# Patient Record
Sex: Male | Born: 1985 | Race: Asian | Hispanic: No | Marital: Married | Smoking: Never smoker
Health system: Southern US, Community
[De-identification: ages and names within clinical notes are randomized; demographics above are authoritative.]

## PROBLEM LIST (undated history)

## (undated) DIAGNOSIS — M109 Gout, unspecified: Secondary | ICD-10-CM

## (undated) HISTORY — DX: Gout, unspecified: M10.9

---

## 2013-03-12 DIAGNOSIS — E66812 Obesity, class 2: Secondary | ICD-10-CM | POA: Insufficient documentation

## 2013-03-12 DIAGNOSIS — E669 Obesity, unspecified: Secondary | ICD-10-CM | POA: Insufficient documentation

## 2013-03-15 DIAGNOSIS — E785 Hyperlipidemia, unspecified: Secondary | ICD-10-CM | POA: Insufficient documentation

## 2013-03-15 DIAGNOSIS — E559 Vitamin D deficiency, unspecified: Secondary | ICD-10-CM | POA: Insufficient documentation

## 2015-03-02 ENCOUNTER — Ambulatory Visit (INDEPENDENT_AMBULATORY_CARE_PROVIDER_SITE_OTHER): Payer: Worker's Compensation | Admitting: Family Medicine

## 2015-03-02 VITALS — BP 124/80 | HR 95 | Temp 98.7°F | Resp 17 | Ht 68.0 in | Wt 275.0 lb

## 2015-03-02 DIAGNOSIS — T1591XA Foreign body on external eye, part unspecified, right eye, initial encounter: Secondary | ICD-10-CM

## 2015-03-02 MED ORDER — ERYTHROMYCIN 5 MG/GM OP OINT: 1.0000 "application " | TOPICAL_OINTMENT | Freq: Four times a day (QID) | OPHTHALMIC | Status: DC

## 2015-03-02 NOTE — Progress Notes (Signed)
Linnell Fullinged Illingworth 1985/10/06 30 y.o.   Chief Complaint  Patient presents with  . dirt in eyes     Date of Injury: 03/02/15 approximately 9:30 am  History of Present Illness:  Presents for evaluation of work-related complaint. 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 He thinks he had some dirt on his head. He used the eye wash machine at work, but feels like there remains a foreign body. No tearing or discharge. Has foreign body sensation at outer, upper edge. No blurred vision, no orbital pain.   No past medical history on file. No past surgical history on file. No family history on file. Social History  Substance Use Topics  . Smoking status: Never Smoker   . Smokeless tobacco: None  . Alcohol Use: None  Medications, allergies, past medical history, surgical history, family history, social history and problem list reviewed and updated.    Review of Systems  Eyes: Positive for pain and redness. Negative for blurred vision, double vision, photophobia and discharge.    No Known Allergies   Current medications reviewed and updated.   Physical Exam  Constitutional: He is oriented to person, place, and time and well-developed, well-nourished, and in no distress.  HENT:  Head: Normocephalic and atraumatic.  Eyes: EOM are normal. Pupils are equal, round, and reactive to light. Right eye exhibits no discharge. Left eye exhibits no discharge. Right conjunctiva is injected. Right conjunctiva has no hemorrhage. Left conjunctiva is not injected. Left conjunctiva has no hemorrhage.  Right eye irrigated with copious amounts of normal saline then eye numbed with proparacaine and fluorescein applied. There were no areas of uptake under black light.   Pulmonary/Chest: Effort normal.  Musculoskeletal: Normal range of motion.  Neurological: He is alert and oriented to  person, place, and time.  Skin: Skin is warm and dry.  Psychiatric: Mood, memory, affect and judgment normal.  Vitals reviewed.   BP 124/80 mmHg  Pulse 95  Temp(Src) 98.7 F (37.1 C) (Oral)  Resp 17  Ht 5\' 8"  (1.727 m)  Wt 275 lb (124.739 kg)  BMI 41.82 kg/m2  SpO2 97%  vision- right- 20/20, left 20/20, both 20/20 Assessment and Plan:  1. Foreign body, eye, right, initial encounter - patient reports improvement in symptoms following irrigation - written and verbal instructions provided for OTC lubricating gel for comfort, avoid rubbing eyes.  - RTC if any worsening or if no improvement in 2-3 days and will refer to opthalmology.  - erythromycin ophthalmic ointment; Place 1 application into the right eye 4 (four) times daily. For 5 days  Dispense: 3.5 g; Refill: 0   Olean Reeeborah Itzamar Traynor, FNP-BC  [facility]  03/04/2015 8:07 AM

## 2015-03-02 NOTE — Patient Instructions (Signed)
Please use some over the counter lubricating eye gel  I have sent in an antibiotic to your pharmacy If you are not better in 2-3 days, come back in please. If your vision worsens or you get severe pain, come in immediately or go to the ER. Avoid rubbing your eye and wear sunglasses as needed for comfort.

## 2015-11-28 DIAGNOSIS — J3089 Other allergic rhinitis: Secondary | ICD-10-CM | POA: Insufficient documentation

## 2017-04-18 DIAGNOSIS — G8929 Other chronic pain: Secondary | ICD-10-CM | POA: Insufficient documentation

## 2017-05-09 DIAGNOSIS — G8929 Other chronic pain: Secondary | ICD-10-CM | POA: Insufficient documentation

## 2018-11-15 ENCOUNTER — Emergency Department (HOSPITAL_COMMUNITY)
Admission: EM | Admit: 2018-11-15 | Discharge: 2018-11-15 | Disposition: A | Discharge status: Home / Self Care | Payer: No Typology Code available for payment source | Attending: Emergency Medicine | Admitting: Emergency Medicine

## 2018-11-15 ENCOUNTER — Encounter (HOSPITAL_COMMUNITY): Payer: Self-pay | Admitting: Emergency Medicine

## 2018-11-15 ENCOUNTER — Emergency Department (HOSPITAL_COMMUNITY): Payer: Self-pay | Attending: Emergency Medicine

## 2018-11-15 ENCOUNTER — Other Ambulatory Visit: Payer: Self-pay

## 2018-11-15 DIAGNOSIS — Y990 Civilian activity done for income or pay: Secondary | ICD-10-CM | POA: Insufficient documentation

## 2018-11-15 DIAGNOSIS — M545 Low back pain, unspecified: Secondary | ICD-10-CM

## 2018-11-15 DIAGNOSIS — X500XXA Overexertion from strenuous movement or load, initial encounter: Secondary | ICD-10-CM | POA: Diagnosis not present

## 2018-11-15 DIAGNOSIS — Y9289 Other specified places as the place of occurrence of the external cause: Secondary | ICD-10-CM | POA: Diagnosis not present

## 2018-11-15 DIAGNOSIS — Y9389 Activity, other specified: Secondary | ICD-10-CM | POA: Insufficient documentation

## 2018-11-15 MED ORDER — LIDOCAINE 5 % EX PTCH
1.0000 | MEDICATED_PATCH | CUTANEOUS | Status: DC
  Administered 2018-11-15: 1 via TRANSDERMAL
  Filled 2018-11-15: qty 1

## 2018-11-15 MED ORDER — METHOCARBAMOL 500 MG PO TABS
500.0000 mg | ORAL_TABLET | Freq: Once | ORAL | Status: AC
  Administered 2018-11-15: 500 mg via ORAL
  Filled 2018-11-15: qty 1

## 2018-11-15 MED ORDER — NAPROXEN 250 MG PO TABS
500.0000 mg | ORAL_TABLET | Freq: Once | ORAL | Status: AC
  Administered 2018-11-15: 500 mg via ORAL
  Filled 2018-11-15: qty 2

## 2018-11-15 MED ORDER — NAPROXEN 500 MG PO TABS: 500.0000 mg | ORAL_TABLET | Freq: Two times a day (BID) | ORAL | 0 refills | Status: DC

## 2018-11-15 MED ORDER — METHOCARBAMOL 500 MG PO TABS: 500.0000 mg | ORAL_TABLET | Freq: Two times a day (BID) | ORAL | 0 refills | Status: DC

## 2018-11-15 MED ORDER — OXYCODONE-ACETAMINOPHEN 5-325 MG PO TABS
1.0000 | ORAL_TABLET | Freq: Once | ORAL | Status: AC
  Administered 2018-11-15: 1 via ORAL
  Filled 2018-11-15: qty 1

## 2018-11-15 NOTE — ED Triage Notes (Signed)
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

## 2018-11-15 NOTE — ED Notes (Signed)
Ambulated pt in room on pulse ox, pt SpO2 sats stayed at 98%

## 2018-11-15 NOTE — Discharge Instructions (Signed)
You were seen here today for Back Pain: Low back pain is discomfort in the lower back that may be due to injuries to muscles and ligaments around the spine. Occasionally, it may be caused by a problem to a part of the spine called a disc. Your back pain should be treated with medicines listed below as well as back exercises and this back pain should get better over the next 2 weeks. Most patients get completely well in 4 weeks. It is important to know however, if you develop severe or worsening pain, low back pain with fever, numbness, weakness or inability to walk or urinate, you should return to the ER immediately.  Please follow up with your doctor this week for a recheck if still having symptoms.  HOME INSTRUCTIONS Self - care:  The application of heat can help soothe the pain.  Maintaining your daily activities, including walking (this is encouraged), as it will help you get better faster than just staying in bed. Do not life, push, pull anything more than 10 pounds for the next week. I am attaching back exercises that you can do at home to help facilitate your recovery.   Back Exercises - I have attached a handout on back exercises that can be done at home to help facilitate your recovery.   Medications are also useful to help with pain control.   Acetaminophen.  This medication is generally safe, and found over the counter. Take as directed for your age. You should not take more than 8 of the extra strength (500mg ) pills a day (max dose is 4000mg  total OVER one day)  Lidocaine patches: You can purchase over-the-counter salon pas lidocaine patches (blue and silver box), these can be placed directly over the area of pain in last 12 hours.  They are safe and do not interact with other medications.  Non steroidal anti inflammatory: This includes medications including Ibuprofen, naproxen and Mobic; These medications help both pain and swelling and are very useful in treating back pain.  They should be  taken with food, as they can cause stomach upset, and more seriously, stomach bleeding. Do not combine the medications.   Muscle relaxants:  These medications can help with muscle tightness that is a cause of lower back pain.  Most of these medications can cause drowsiness, and it is not safe to drive or use dangerous machinery while taking them. They are primarily helpful when taken at night before sleep.   You will need to follow up with your primary healthcare provider or the Orthopedist in 1-2 weeks for reassessment and persistent symptoms.  Be aware that if you develop new symptoms, such as a fever, leg weakness, difficulty with or loss of control of your urine or bowels, abdominal pain, or more severe pain, you will need to seek medical attention and/or return to the Emergency department.  Additional Information:  Your vital signs today were: BP 131/89 (BP Location: Right Arm)    Pulse 65    Temp (!) 97.5 F (36.4 C) (Oral)    Resp 17    SpO2 99%  If your blood pressure (BP) was elevated above 135/85 this visit, please have this repeated by your doctor within one month. ---------------

## 2018-11-15 NOTE — ED Provider Notes (Signed)
[facility] Note   CSN: [id number] Arrival date & time: 11/15/18  0505     History   Chief Complaint Chief Complaint  Patient presents with  . Back Injury    HPI Marvin Hodge is a 33 y.o. male.     Marvin Hodge is a 33 y.o. male with a history of obesity and hyperlipidemia, who presents to the emergency department for evaluation of back injury.  Patient reports while at work this morning he was in a crouched position and tried to lift something and pull it up and felt pain in the middle of his low back. He denies any fall or trauma to the back.  Reports he had a back injury about a year ago, but has not had any issues with it since then.  He denies any pain radiating into the legs.  No numbness, tingling or weakness.  No loss of bowel or bladder control or saddle anesthesia.  No associated abdominal pain, urinary symptoms, fevers or vomiting.  He has not taken any medications prior to arrival, was given a Percocet on arrival and reports significant improvement in his pain.     History reviewed. No pertinent past medical history.  Patient Active Problem List   Diagnosis Date Noted  . HLD (hyperlipidemia) 03/15/2013  . Avitaminosis D 03/15/2013  . Class 2 obesity 03/12/2013    History reviewed. No pertinent surgical history.      Home Medications    Prior to Admission medications   Medication Sig Start Date End Date Taking? Authorizing Provider  erythromycin ophthalmic ointment Place 1 application into the right eye 4 (four) times daily. For 5 days 03/02/15   Emi BelfastGessner, Deborah B, FNP    Family History No family history on file.  Social History Social History   Tobacco Use  . Smoking status: Never Smoker  . Smokeless tobacco: Never Used  Substance Use Topics  . Alcohol use: Never    Alcohol/week: 0.0 standard drinks    Frequency: Never  . Drug use: Never     Allergies   Patient has no known allergies.   Review of  Systems Review of Systems  Constitutional: Negative for chills and fever.  HENT: Negative.   Respiratory: Negative for shortness of breath.   Cardiovascular: Negative for chest pain.  Gastrointestinal: Negative for abdominal pain, constipation, diarrhea, nausea and vomiting.  Genitourinary: Negative for dysuria, flank pain, frequency and hematuria.  Musculoskeletal: Positive for back pain. Negative for arthralgias, gait problem, joint swelling, myalgias and neck pain.  Skin: Negative for color change, rash and wound.  Neurological: Negative for weakness and numbness.     Physical Exam Updated Vital Signs BP 131/89 (BP Location: Right Arm)   Pulse 65   Temp (!) 97.5 F (36.4 C) (Oral)   Resp 17   SpO2 99%   Physical Exam Vitals signs and nursing note reviewed.  Constitutional:      General: He is not in acute distress.    Appearance: Normal appearance. He is well-developed and normal weight. He is not diaphoretic.     Comments: Well-appearing and in no acute distress.  HENT:     Head: Atraumatic.  Eyes:     General:        Right eye: No discharge.        Left eye: No discharge.  Neck:     Musculoskeletal: Neck supple.  Cardiovascular:     Rate and Rhythm: Normal rate and regular rhythm.  Pulses: Normal pulses.          Radial pulses are 2+ on the right side and 2+ on the left side.       Dorsalis pedis pulses are 2+ on the right side and 2+ on the left side.       Posterior tibial pulses are 2+ on the right side and 2+ on the left side.     Heart sounds: Normal heart sounds.  Pulmonary:     Effort: Pulmonary effort is normal. No respiratory distress.     Breath sounds: Normal breath sounds.     Comments: Respirations equal and unlabored, patient able to speak in full sentences, lungs clear to auscultation bilaterally Abdominal:     General: Bowel sounds are normal. There is no distension.     Palpations: Abdomen is soft. There is no mass.     Tenderness: There is  no abdominal tenderness. There is no guarding.     Comments: Abdomen soft, nondistended, nontender to palpation in all quadrants without guarding or peritoneal signs, no CVA tenderness bilaterally  Musculoskeletal:     Comments: Tenderness to palpation over midline lumbar spine just above the sacrum, no overlying skin changes or palpable deformity..  Pain made worse with range of motion of the lower extremities, negative straight leg raise bilaterally.  Skin:    General: Skin is warm and dry.     Capillary Refill: Capillary refill takes less than 2 seconds.  Neurological:     Mental Status: He is alert and oriented to person, place, and time.     Comments: Alert, clear speech, following commands. Moving all extremities without difficulty. Bilateral lower extremities with 5/5 strength in proximal and distal muscle groups and with dorsi and plantar flexion. Sensation intact in bilateral lower extremities. 2+ patellar DTRs bilaterally. Ambulatory with steady gait  Psychiatric:        Mood and Affect: Mood normal.        Behavior: Behavior normal.      ED Treatments / Results  Labs (all labs ordered are listed, but only abnormal results are displayed) Labs Reviewed - No data to display  EKG None  Radiology Dg Lumbar Spine Complete  Result Date: 11/15/2018 CLINICAL DATA:  Injury with low back pain EXAM: LUMBAR SPINE - COMPLETE 4+ VIEW COMPARISON:  None. FINDINGS: No evidence of acute fracture. 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 Mild L4-5 and L5-S1 disc narrowing which may be partially developmental. IMPRESSION: 1. No acute finding. 2. 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 Electronically Signed   By: Marnee Spring M.D.   On: 11/15/2018 05:57    Procedures Procedures (including critical care time)  Medications Ordered in ED Medications  methocarbamol (ROBAXIN) tablet 500 mg (has no administration in time range)  naproxen (NAPROSYN) tablet 500 mg (has no  administration in time range)  lidocaine (LIDODERM) 5 % 1 patch (has no administration in time range)  oxyCODONE-acetaminophen (PERCOCET/ROXICET) 5-325 MG per tablet 1 tablet (1 tablet Oral Given 11/15/18 0527)     Initial Impression / Assessment and Plan / ED Course  I have reviewed the triage vital signs and the nursing notes.  Pertinent labs & imaging results that were available during my care of the patient were reviewed by me and considered in my medical decision making (see chart for details).  Patient with back pain after an injury at work where he was crouched in a position and tried to lift something and felt like  he pulled a muscle in his back..  No neurological deficits and normal neuro exam.  Patient can walk but states is painful.  No loss of bowel or bladder control.  No concern for cauda equina.  No fever, night sweats, weight loss, h/o cancer, IVDU.  X-ray shows no acute findings, there is an L5 pars defect with mild lower lumbar disc narrowing but no acute abnormality.  Pain significantly improved with treatment here in the ED and patient is ambulatory.  Will write patient out of work for the next few days.  RICE protocol and pain medicine indicated and discussed with patient.    Final Clinical Impressions(s) / ED Diagnoses   Final diagnoses:  Acute midline low back pain without sciatica    ED Discharge Orders         Ordered    naproxen (NAPROSYN) 500 MG tablet  2 times daily     11/15/18 0859    methocarbamol (ROBAXIN) 500 MG tablet  2 times daily     11/15/18 0859           Jacqlyn Larsen, PA-C 11/15/18 9163    Davonna Belling, MD 11/15/18 1555

## 2020-02-07 ENCOUNTER — Encounter (HOSPITAL_COMMUNITY): Payer: Self-pay

## 2020-02-07 ENCOUNTER — Other Ambulatory Visit: Payer: Self-pay

## 2020-02-07 ENCOUNTER — Ambulatory Visit (HOSPITAL_COMMUNITY)
Admission: EM | Admit: 2020-02-07 | Discharge: 2020-02-07 | Disposition: A | Discharge status: Home / Self Care | Payer: Commercial Managed Care - PPO | Attending: Family Medicine | Admitting: Family Medicine

## 2020-02-07 ENCOUNTER — Ambulatory Visit (INDEPENDENT_AMBULATORY_CARE_PROVIDER_SITE_OTHER): Payer: Commercial Managed Care - PPO

## 2020-02-07 DIAGNOSIS — M25562 Pain in left knee: Secondary | ICD-10-CM

## 2020-02-07 DIAGNOSIS — M1712 Unilateral primary osteoarthritis, left knee: Secondary | ICD-10-CM | POA: Diagnosis not present

## 2020-02-07 MED ORDER — MELOXICAM 15 MG PO TABS: 15.0000 mg | ORAL_TABLET | Freq: Every day | ORAL | 0 refills | Status: DC | PRN

## 2020-02-07 MED ORDER — TIZANIDINE HCL 4 MG PO TABS: 4.0000 mg | ORAL_TABLET | Freq: Two times a day (BID) | ORAL | 0 refills | Status: DC | PRN

## 2020-02-07 MED ORDER — PREDNISONE 20 MG PO TABS: ORAL_TABLET | ORAL | 0 refills | Status: DC

## 2020-02-07 NOTE — Discharge Instructions (Signed)
Start and complete course of prednisone. Following completion of prednisone, start Meloxicam as needed for joint stiffness or joint aches. For acute pain, tizanidine 4 mg twice daily.  Avoid driving with medication as medication can cause significant drowsiness.  Recommend use of an Ace wrap for compression to reduce swelling and reduce pain. Follow-up with orthopedics if no improvement with prescribed treatment

## 2020-02-07 NOTE — ED Triage Notes (Signed)
Pt presents with left knee pain non injury related X 3 days.

## 2020-02-07 NOTE — ED Provider Notes (Signed)
[facility]    CSN: [id number] Arrival date & time: 02/07/20  1152      History   Chief Complaint Chief Complaint  Patient presents with  . Knee Pain    HPI Marvin Hodge is a 34 y.o. male.   HPI  Patient presents today with concerns of left knee pain and swelling x3 days.  Patient denies any injury.  He is taking ibuprofen and applying ice and reports that swelling somewhat resolved however he is having severe pain with flexing his left knee and with weightbearing.  Medical history significant for degenerative disc disease.  Patient is obese.  Denies any recent changes in activity that would have caused knee pain.  History reviewed. No pertinent past medical history.  Patient Active Problem List   Diagnosis Date Noted  . HLD (hyperlipidemia) 03/15/2013  . Avitaminosis D 03/15/2013  . Class 2 obesity 03/12/2013    History reviewed. No pertinent surgical history.     Home Medications    Prior to Admission medications   Medication Sig Start Date End Date Taking? Authorizing Provider  erythromycin ophthalmic ointment Place 1 application into the right eye 4 (four) times daily. For 5 days 03/02/15   Emi Belfast, FNP  methocarbamol (ROBAXIN) 500 MG tablet Take 1 tablet (500 mg total) by mouth 2 (two) times daily. 11/15/18   Dartha Lodge, PA-C  naproxen (NAPROSYN) 500 MG tablet Take 1 tablet (500 mg total) by mouth 2 (two) times daily. 11/15/18   Dartha Lodge, PA-C    Family History Family History  Family history unknown: Yes    Social History Social History   Tobacco Use  . Smoking status: Never Smoker  . Smokeless tobacco: Never Used  Substance Use Topics  . Alcohol use: Never    Alcohol/week: 0.0 standard drinks  . Drug use: Never     Allergies   Patient has no known allergies.   Review of Systems Review of Systems Pertinent negatives listed in HPI  Physical Exam Triage Vital Signs ED Triage Vitals  Enc Vitals Group     BP  02/07/20 1258 125/73     Pulse Rate 02/07/20 1258 76     Resp 02/07/20 1258 18     Temp 02/07/20 1258 98.1 F (36.7 C)     Temp Source 02/07/20 1258 Oral     SpO2 02/07/20 1258 99 %     Weight --      Height --      Head Circumference --      Peak Flow --      Pain Score 02/07/20 1257 7     Pain Loc --      Pain Edu? --      Excl. in GC? --    No data found.  Updated Vital Signs BP 125/73 (BP Location: Right Arm)   Pulse 76   Temp 98.1 F (36.7 C) (Oral)   Resp 18   SpO2 99%   Visual Acuity Right Eye Distance:   Left Eye Distance:   Bilateral Distance:    Right Eye Near:   Left Eye Near:    Bilateral Near:     Physical Exam Constitutional:      Appearance: He is obese.  Cardiovascular:     Rate and Rhythm: Normal rate and regular rhythm.  Pulmonary:     Effort: Pulmonary effort is normal.     Breath sounds: Normal breath sounds.  Musculoskeletal:     Left  knee: Bony tenderness present. Decreased range of motion. Tenderness present.       Legs:  Skin:    General: Skin is warm.     Capillary Refill: Capillary refill takes less than 2 seconds.  Neurological:     Mental Status: He is alert.  Psychiatric:        Attention and Perception: Attention normal.        Mood and Affect: Mood normal.        Thought Content: Thought content normal.        Cognition and Memory: Cognition and memory normal.      UC Treatments / Results  Labs (all labs ordered are listed, but only abnormal results are displayed) Labs Reviewed - No data to display  EKG   Radiology No results found.  Procedures Procedures (including critical care time)  Medications Ordered in UC Medications - No data to display  Initial Impression / Assessment and Plan / UC Course  I have reviewed the triage vital signs and the nursing notes.  Pertinent labs & imaging results that were available during my care of the patient were reviewed by me and considered in my medical decision making  (see chart for details).    Left knee pain, on x-ray, advanced arthritic changes noted on exam. Will trial a prednisone taper x 9 days. Tizanidine PRN for pain. Meloxicam PRN following prednisone taper. Follow-up with orthopedics if not improvement.  Final Clinical Impressions(s) / UC Diagnoses   Final diagnoses:  Primary osteoarthritis of left knee     Discharge Instructions     Start and complete course of prednisone. Following completion of prednisone, start Meloxicam as needed for joint stiffness or joint aches. For acute pain, tizanidine 4 mg twice daily.  Avoid driving with medication as medication can cause significant drowsiness.  Recommend use of an Ace wrap for compression to reduce swelling and reduce pain. Follow-up with orthopedics if no improvement with prescribed treatment      ED Prescriptions    Medication Sig Dispense Auth. Provider   predniSONE (DELTASONE) 20 MG tablet Take 3 PO QAM x3days, 2 PO QAM x3days, 1 PO QAM x3days 18 tablet Bing Neighbors, FNP   meloxicam (MOBIC) 15 MG tablet Take 1 tablet (15 mg total) by mouth daily as needed for pain. 30 tablet Bing Neighbors, FNP   tiZANidine (ZANAFLEX) 4 MG tablet Take 1 tablet (4 mg total) by mouth 2 (two) times daily as needed for muscle spasms. 30 tablet Bing Neighbors, FNP     PDMP not reviewed this encounter.   Bing Neighbors, FNP 02/09/20 2129

## 2020-02-28 ENCOUNTER — Other Ambulatory Visit: Payer: Self-pay | Admitting: Family Medicine

## 2020-02-28 NOTE — Telephone Encounter (Signed)
   Notes to clinic Not a provider we approve rx for.  

## 2021-01-28 IMAGING — DX DG LUMBAR SPINE COMPLETE 4+V
5 series · 5 of 5 positions shown · non-contrast
Comparison: None.

CLINICAL DATA: Injury with low back pain

EXAM:
LUMBAR SPINE - COMPLETE 4+ VIEW

[l-spine ap]
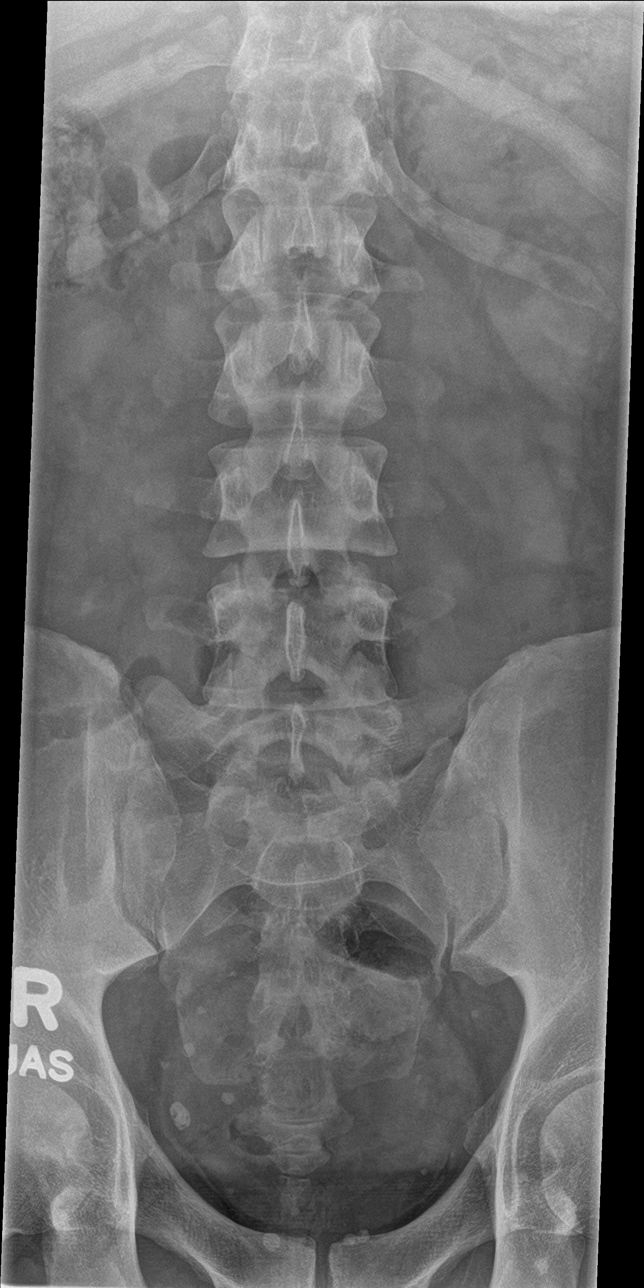

[l-spine obl (1 of 2)]
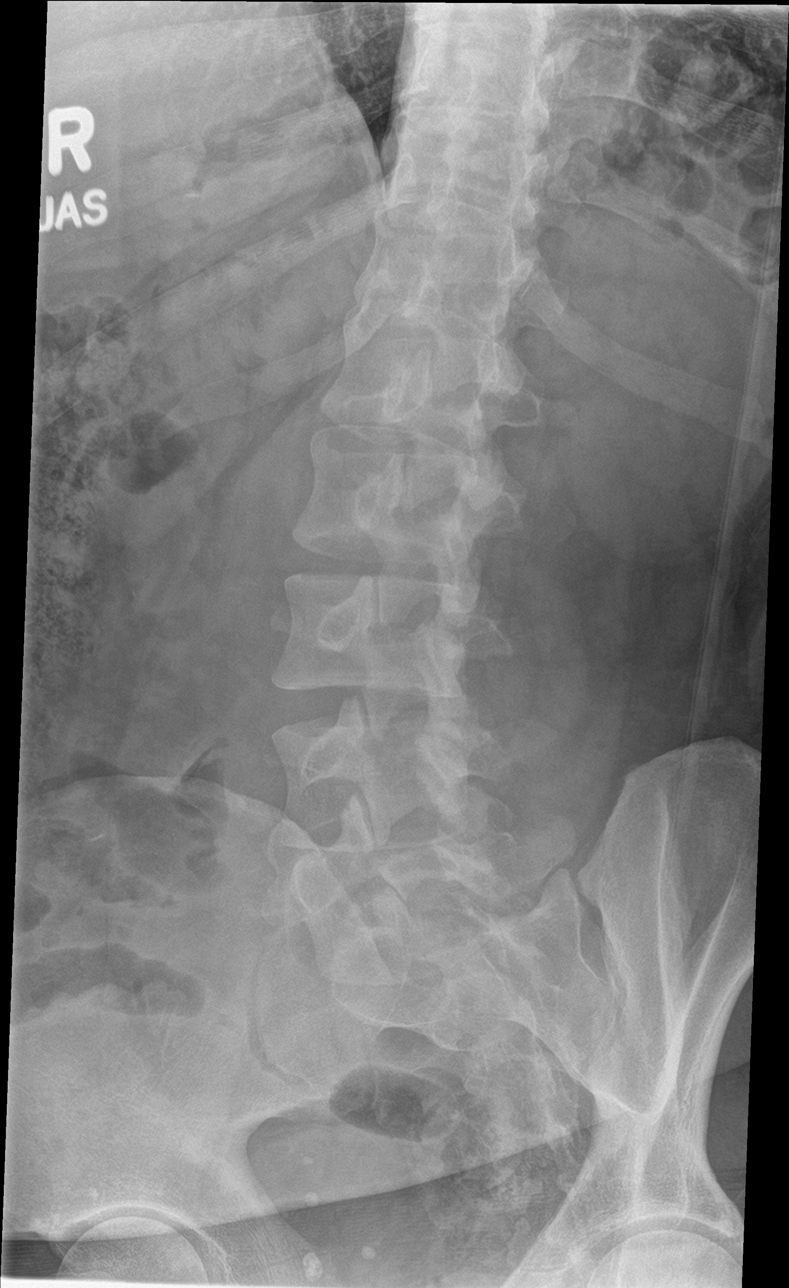

[l-spine obl (2 of 2)]
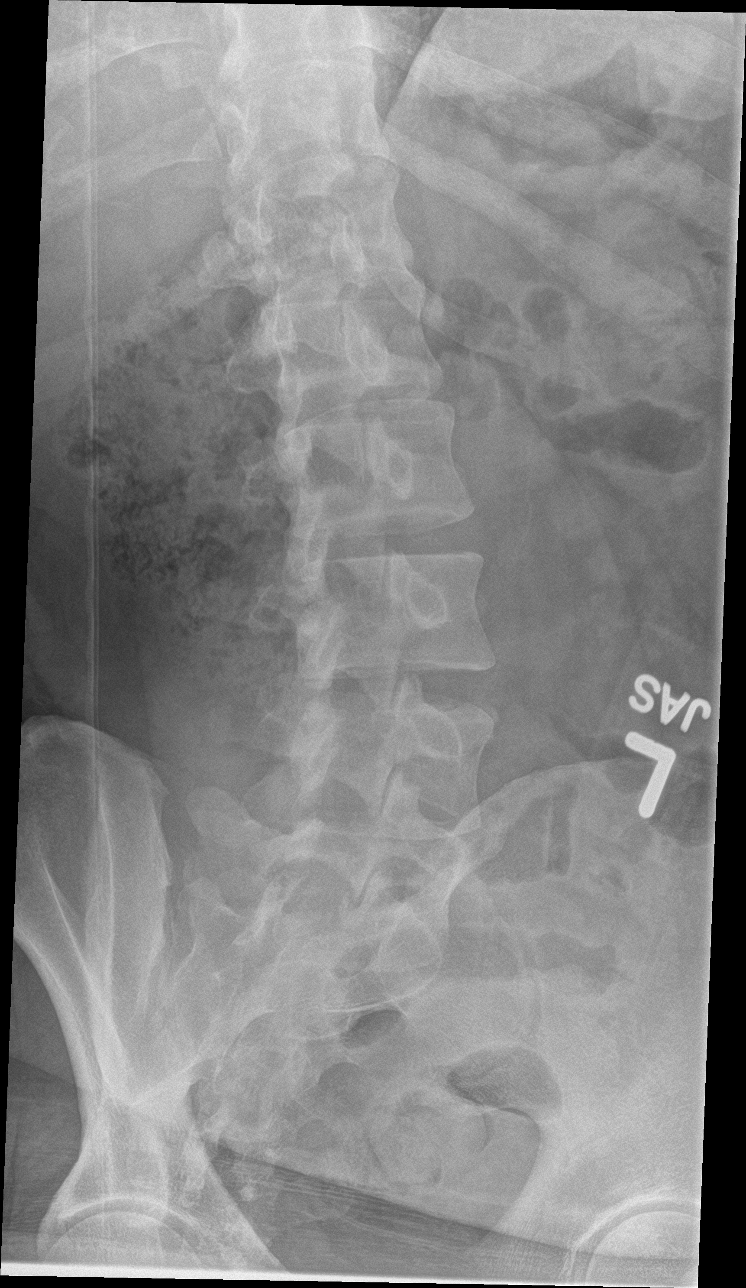

[l-spine lat]
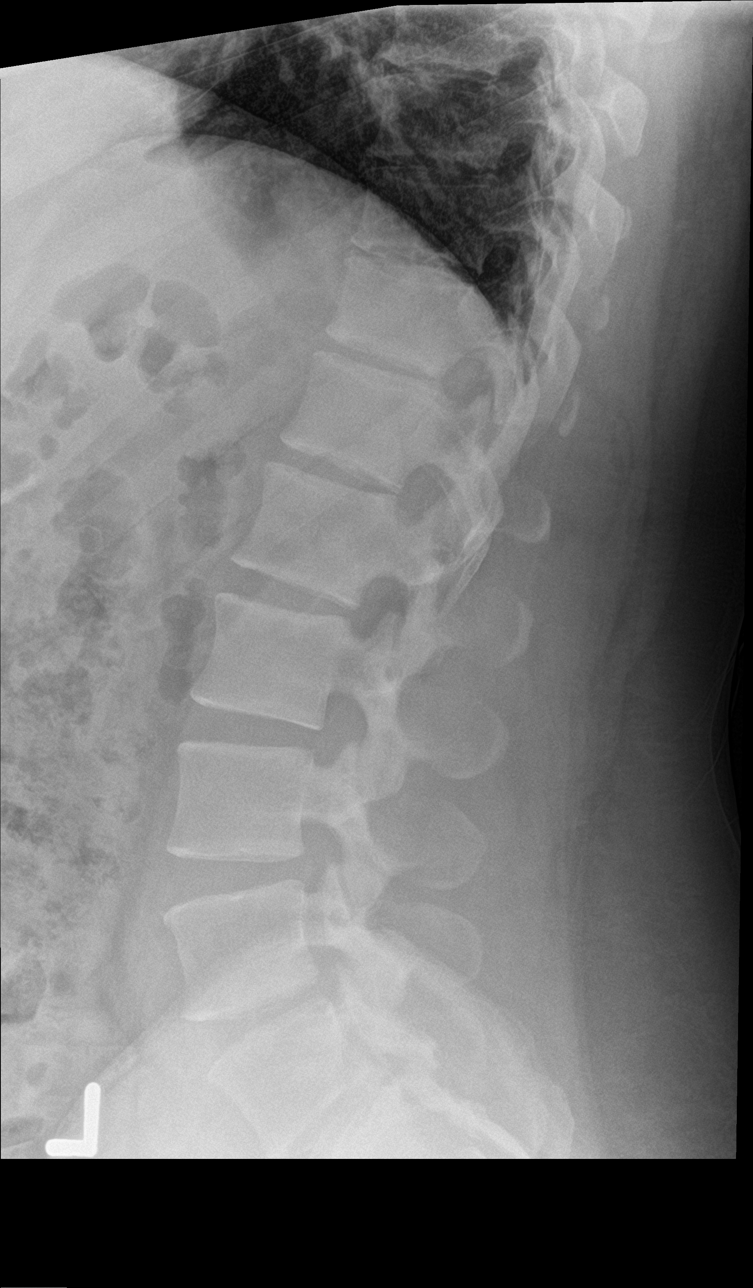

[l-spine spot]
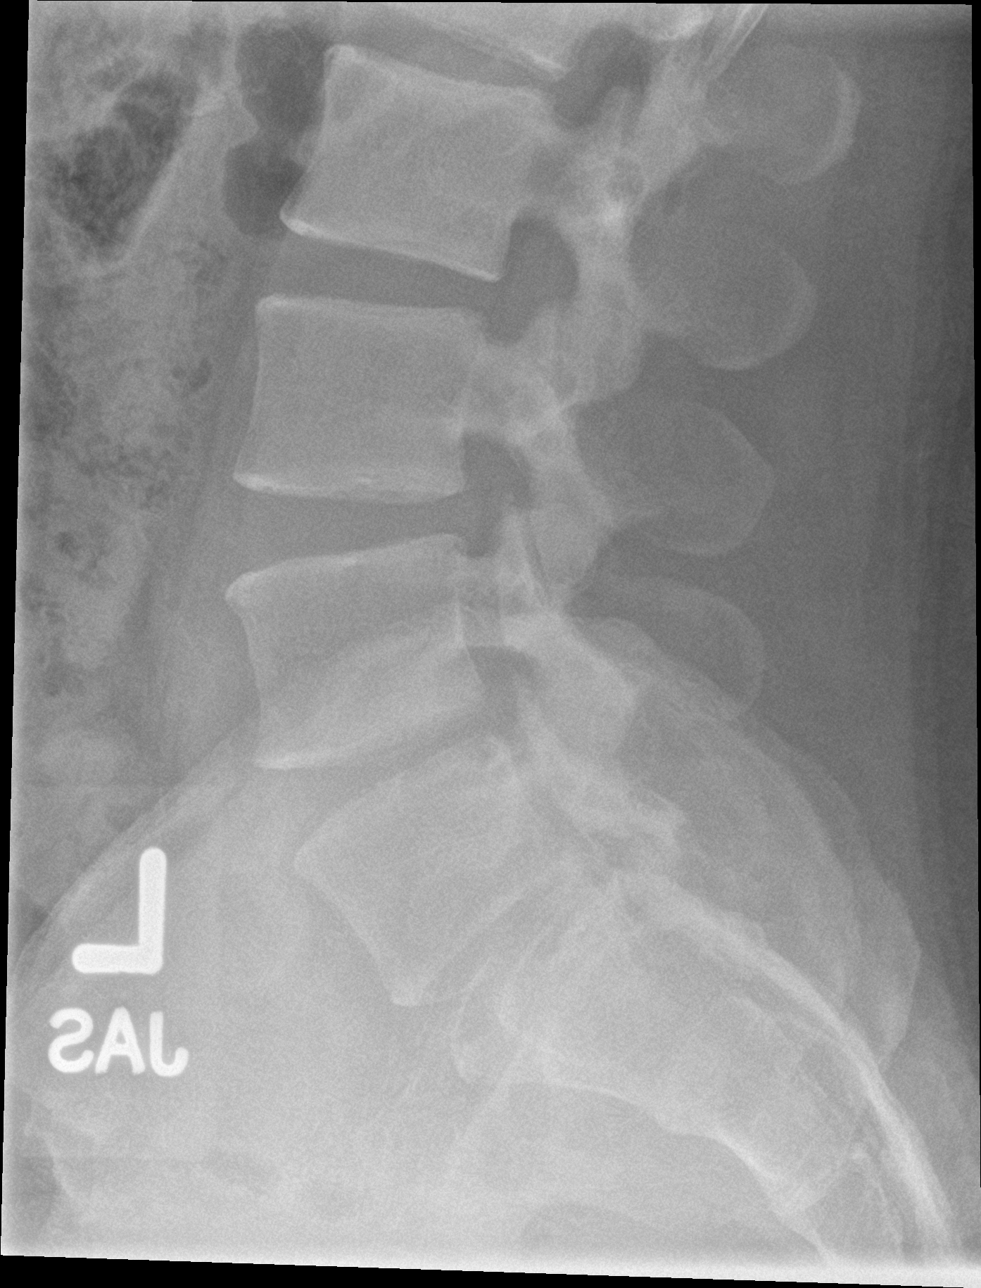

[5 of 5 positions shown; findings below may reference images not displayed]

FINDINGS: No evidence of acute fracture.

Probable right L5 pars defect. Mild L4-5 and L5-S1 disc narrowing
which may be partially developmental.
IMPRESSION: 1. No acute finding.
2. L5 right pars defect with mild lower lumbar disc narrowing.

## 2023-03-07 ENCOUNTER — Other Ambulatory Visit: Payer: Self-pay

## 2023-03-07 ENCOUNTER — Emergency Department (HOSPITAL_COMMUNITY)
Admission: EM | Admit: 2023-03-07 | Discharge: 2023-03-07 | Disposition: A | Discharge status: Home / Self Care | Payer: Managed Care, Other (non HMO) | Attending: Emergency Medicine | Admitting: Emergency Medicine

## 2023-03-07 ENCOUNTER — Emergency Department (HOSPITAL_COMMUNITY): Payer: Managed Care, Other (non HMO)

## 2023-03-07 DIAGNOSIS — X501XXA Overexertion from prolonged static or awkward postures, initial encounter: Secondary | ICD-10-CM | POA: Diagnosis not present

## 2023-03-07 DIAGNOSIS — S93402A Sprain of unspecified ligament of left ankle, initial encounter: Secondary | ICD-10-CM | POA: Insufficient documentation

## 2023-03-07 DIAGNOSIS — S99912A Unspecified injury of left ankle, initial encounter: Secondary | ICD-10-CM | POA: Diagnosis present

## 2023-03-07 MED ORDER — IBUPROFEN 600 MG PO TABS: 600.0000 mg | ORAL_TABLET | Freq: Four times a day (QID) | ORAL | 0 refills | Status: DC

## 2023-03-07 MED ORDER — HYDROCODONE-ACETAMINOPHEN 5-325 MG PO TABS: 1.0000 | ORAL_TABLET | Freq: Four times a day (QID) | ORAL | 0 refills | Status: DC | PRN

## 2023-03-07 NOTE — ED Notes (Signed)
Patient transported to CT 

## 2023-03-07 NOTE — ED Triage Notes (Signed)
Pt. Stated, I was cooking and turned and twisted left ankle. Went to UC and they sent me here for further evaluation. They are concerned with blood clot also. I brought the xrays which they said I have 2 small fractures.

## 2023-03-07 NOTE — ED Provider Notes (Signed)
[place] AT [facility] Provider Note   CSN: [id number] Arrival date & time: 03/07/23  4782     History  Chief Complaint  Patient presents with   Ankle Pain    Marvin Hodge is a 38 y.o. male.  Patient to ED from Urgent Care after evaluation of left ankle inversion injury that occurred 5 days ago. He reports persistent swelling and pain with weight bearing. Urgent Care concerned for fractures and/or potential fractures seen on xrays performed in their facility. Also reported concern for elevated heart rate considering potential blood clot. No SOB, CP  The history is provided by the patient. No language interpreter was used.  Ankle Pain      Home Medications Prior to Admission medications   Medication Sig Start Date End Date Taking? Authorizing Provider  HYDROcodone-acetaminophen (NORCO) 5-325 MG tablet Take 1 tablet by mouth every 6 (six) hours as needed for moderate pain (pain score 4-6). 03/07/23  Yes Allyana Vogan, Melvenia Beam, PA-C  ibuprofen (ADVIL) 600 MG tablet Take 1 tablet (600 mg total) by mouth 4 (four) times daily. 03/07/23  Yes Elpidio Anis, PA-C      Allergies    Patient has no known allergies.    Review of Systems   Review of Systems  Physical Exam Updated Vital Signs BP 126/79 (BP Location: Right Arm)   Pulse (!) 110   Temp 99.7 F (37.6 C) (Temporal)   Resp 14   Ht 5\' 8"  (1.727 m)   Wt 134.7 kg   SpO2 99%   BMI 45.16 kg/m  Physical Exam Vitals and nursing note reviewed.  Constitutional:      Appearance: He is well-developed.  Cardiovascular:     Pulses: Normal pulses.  Pulmonary:     Effort: Pulmonary effort is normal.  Musculoskeletal:        General: Normal range of motion.     Cervical back: Normal range of motion.     Comments: Left ankle moderately swollen without deformity. Joint stable. Achilles intact. No discoloration. DP pulse present. No lower leg swelling or calf tenderness.   Skin:    General: Skin is warm  and dry.  Neurological:     Mental Status: He is alert and oriented to person, place, and time.     Sensory: No sensory deficit.     ED Results / Procedures / Treatments   Labs (all labs ordered are listed, but only abnormal results are displayed) Labs Reviewed - No data to display  EKG None  Radiology CT Ankle Left Wo Contrast Result Date: 03/07/2023 CLINICAL DATA:  Ankle fracture trauma. EXAM: CT OF THE LEFT ANKLE WITHOUT CONTRAST TECHNIQUE: Multidetector CT imaging of the left ankle was performed according to the standard protocol. Multiplanar CT image reconstructions were also generated. RADIATION DOSE REDUCTION: This exam was performed according to the departmental dose-optimization program which includes automated exposure control, adjustment of the mA and/or kV according to patient size and/or use of iterative reconstruction technique. COMPARISON:  None Available. FINDINGS: Bones/Joint/Cartilage Plantar and Achilles calcaneal spurs. Mild dorsal spurring of the talar neck mild spurring of the distal tibial rim. No fracture identified. Ligaments Suboptimally assessed by CT. Muscles and Tendons Unremarkable Soft tissues Subcutaneous edema lateral to the lateral malleolus. Small calcified cutaneous lesion along the lateral ankle shown on image 70 series 9. Small amount of edema tracks superficial to the extensor digitorum brevis muscle. IMPRESSION: 1. No fracture is identified. 2. Subcutaneous edema lateral to the lateral malleolus. Small  amount of edema tracks superficial to the extensor digitorum brevis muscle. 3. Plantar and Achilles calcaneal spurs. 4. Mild degenerative spurring of the talar neck and distal tibial rim. Electronically Signed   By: Gaylyn Rong M.D.   On: 03/07/2023 14:25    Procedures Procedures    Medications Ordered in ED Medications - No data to display  ED Course/ Medical Decision Making/ A&P Clinical Course as of 03/07/23 1500  Tue Mar 07, 2023  1322  Patient to ED from Urgent Care for further evaluation of left ankle injury x 5 days ago, reporting concern for 2 fractures per report at Urgent Care. Also for a reported concern for tachycardia as relates potential for DVT/PE. CT of the ankle pending.  [SU]  1455 CT negative for fracture in the ankle. No soft tissue rupture.   Patient's heart rate 110-116 in the ED. No chest pain, SOB, hypoxia. No calf swelling or tenderness. He did travel 3 weeks ago by plan but 4 hour trip broken up into 2 flights. He is considered low risk for clot. Mild tachycardia here felt secondary to uncontrolled pain. Discussed return precautions.  [SU]    Clinical Course User Index [SU] Elpidio Anis, PA-C                                 Medical Decision Making Amount and/or Complexity of Data Reviewed Radiology: ordered.  Risk Prescription drug management.           Final Clinical Impression(s) / ED Diagnoses Final diagnoses:  Sprain of left ankle, unspecified ligament, initial encounter    Rx / DC Orders ED Discharge Orders          Ordered    ibuprofen (ADVIL) 600 MG tablet  4 times daily        03/07/23 1444    HYDROcodone-acetaminophen (NORCO) 5-325 MG tablet  Every 6 hours PRN        03/07/23 1444              Elpidio Anis, PA-C 03/07/23 1500    Elayne Snare K, DO 03/07/23 1520

## 2023-03-07 NOTE — Discharge Instructions (Signed)
Ice and elevate the ankle to reduce swelling. Take ibuprofen 600 mg 4 times daily, and Norco for pain if needed. Use your crutches to be weight bearing as tolerated.   If pain does not improve in the next 3-4 days, follow up with Dr. Victorino Dike with orthopedics for further management.

## 2023-03-20 ENCOUNTER — Ambulatory Visit (INDEPENDENT_AMBULATORY_CARE_PROVIDER_SITE_OTHER): Payer: Managed Care, Other (non HMO)

## 2023-03-20 ENCOUNTER — Ambulatory Visit
Admission: EM | Admit: 2023-03-20 | Discharge: 2023-03-20 | Disposition: A | Discharge status: Home / Self Care | Payer: Managed Care, Other (non HMO) | Attending: Internal Medicine | Admitting: Internal Medicine

## 2023-03-20 ENCOUNTER — Encounter: Payer: Self-pay | Admitting: Emergency Medicine

## 2023-03-20 DIAGNOSIS — M7661 Achilles tendinitis, right leg: Secondary | ICD-10-CM

## 2023-03-20 MED ORDER — PREDNISONE 50 MG PO TABS: ORAL_TABLET | ORAL | 0 refills | Status: DC

## 2023-03-20 NOTE — Discharge Instructions (Addendum)
 Return if any problems.

## 2023-03-20 NOTE — ED Triage Notes (Signed)
Pt presents with right heel pain. Symptoms onset a week ago.

## 2023-03-20 NOTE — ED Provider Notes (Signed)
[facility]    CSN: [id number] Arrival date & time: 03/20/23  0917      History   Chief Complaint Chief Complaint  Patient presents with   Foot Pain    HPI Marvin Hodge is a 38 y.o. male.   Patient complains of pain to his right foot and heel area.  Patient reports he was recently treated for a bursitis.  Patient states he is finished taking antibiotic and ibuprofen without relief.  Patient is to the Achilles area of his heel as the area of pain.  Patient has pain with movement.  The history is provided by the patient. No language interpreter was used.  Foot Pain    History reviewed. No pertinent past medical history.  Patient Active Problem List   Diagnosis Date Noted   HLD (hyperlipidemia) 03/15/2013   Avitaminosis D 03/15/2013   Class 2 obesity 03/12/2013    History reviewed. No pertinent surgical history.     Home Medications    Prior to Admission medications   Medication Sig Start Date End Date Taking? Authorizing Provider  predniSONE (DELTASONE) 50 MG tablet One tablet a day 03/20/23  Yes Elson Areas, PA-C  HYDROcodone-acetaminophen (NORCO) 5-325 MG tablet Take 1 tablet by mouth every 6 (six) hours as needed for moderate pain (pain score 4-6). 03/07/23   Elpidio Anis, PA-C  ibuprofen (ADVIL) 600 MG tablet Take 1 tablet (600 mg total) by mouth 4 (four) times daily. 03/07/23   Elpidio Anis, PA-C    Family History Family History  Family history unknown: Yes    Social History Social History   Tobacco Use   Smoking status: Never    Passive exposure: Never   Smokeless tobacco: Never  Substance Use Topics   Alcohol use: Never    Alcohol/week: 0.0 standard drinks of alcohol   Drug use: Never     Allergies   Patient has no known allergies.   Review of Systems Review of Systems  Musculoskeletal:  Positive for joint swelling.  All other systems reviewed and are negative.    Physical Exam Triage Vital Signs ED Triage Vitals   Encounter Vitals Group     BP 03/20/23 1235 (!) 129/91     Systolic BP Percentile --      Diastolic BP Percentile --      Pulse Rate 03/20/23 1235 80     Resp 03/20/23 1235 18     Temp 03/20/23 1235 97.6 F (36.4 C)     Temp Source 03/20/23 1235 Oral     SpO2 03/20/23 1235 98 %     Weight --      Height --      Head Circumference --      Peak Flow --      Pain Score 03/20/23 1233 8     Pain Loc --      Pain Education --      Exclude from Growth Chart --    No data found.  Updated Vital Signs BP (!) 129/91 (BP Location: Left Arm)   Pulse 80   Temp 97.6 F (36.4 C) (Oral)   Resp 18   SpO2 98%   Visual Acuity Right Eye Distance:   Left Eye Distance:   Bilateral Distance:    Right Eye Near:   Left Eye Near:    Bilateral Near:     Physical Exam Vitals reviewed.  Cardiovascular:     Rate and Rhythm: Normal rate.  Pulmonary:  Effort: Pulmonary effort is normal.  Musculoskeletal:        General: Swelling and tenderness present.     Comments: Swollen right Achilles area pain with range of motion, tender heel to palpation, no erythema, no sign of infection neurovascular neurosensory intact  Skin:    General: Skin is warm.  Neurological:     General: No focal deficit present.      UC Treatments / Results  Labs (all labs ordered are listed, but only abnormal results are displayed) Labs Reviewed - No data to display  EKG   Radiology DG Foot Complete Right Result Date: 03/20/2023 CLINICAL DATA:  Right heel pain starting 1 week ago. EXAM: RIGHT FOOT COMPLETE - 3+ VIEW COMPARISON:  None Available. FINDINGS: Normal bone mineralization. Minimal degenerative spurring at the lateral aspect of the great toe metatarsophalangeal joint without joint space narrowing. Mild-to-moderate plantar and posterior calcaneal heel spurs. Mild corticated 3 mm ossicle at the distal anterior aspect of the tibial plafond, likely the sequela of remote trauma. No acute fracture or  dislocation. IMPRESSION: Mild-to-moderate plantar and posterior calcaneal heel spurs. Electronically Signed   By: Neita Garnet M.D.   On: 03/20/2023 13:16    Procedures Procedures (including critical care time)  Medications Ordered in UC Medications - No data to display  Initial Impression / Assessment and Plan / UC Course  I have reviewed the triage vital signs and the nursing notes.  Pertinent labs & imaging results that were available during my care of the patient were reviewed by me and considered in my medical decision making (see chart for details).     I suspect patient has an Achilles tendinitis.  Patient is placed in a cam walker he is advised to follow-up with orthopedist for further evaluation I will try him on a short course of prednisone to see if this helps. Final Clinical Impressions(s) / UC Diagnoses   Final diagnoses:  Tendonitis, Achilles, right     Discharge Instructions      Return if any problems.     ED Prescriptions     Medication Sig Dispense Auth. Provider   predniSONE (DELTASONE) 50 MG tablet One tablet a day 5 tablet Elson Areas, [place]      PDMP not reviewed this encounter. An After Visit Summary was printed and given to the patient.       Elson Areas, [place] 03/20/23 1501

## 2023-03-22 DIAGNOSIS — Z0000 Encounter for general adult medical examination without abnormal findings: Secondary | ICD-10-CM | POA: Insufficient documentation

## 2023-03-22 NOTE — Progress Notes (Signed)
  Intake history:  BP (!) 130/90   Pulse 84   Ht 5' 8 (1.727 m)   Wt 300 lb (136.1 kg)   BMI 45.61 kg/m  Body mass index is 45.61 kg/m.    WHAT ARE WE SEEING YOU FOR TODAY?   right foot/feet  How long has this bothered you? (DOI?DOS?WS?)  1 week(s) ago  Anticoag.  No  Diabetes No  Heart disease No  Hypertension No  SMOKING HX No  Kidney disease No  Any ALLERGIES ______________________________________________   Treatment:  Have you taken:  Tylenol  No  Advil  Yes  Had PT No  Had injection No  Other  _________________________

## 2023-03-24 ENCOUNTER — Ambulatory Visit (INDEPENDENT_AMBULATORY_CARE_PROVIDER_SITE_OTHER): Payer: Managed Care, Other (non HMO) | Admitting: Orthopedic Surgery

## 2023-03-24 ENCOUNTER — Encounter: Payer: Self-pay | Admitting: Orthopedic Surgery

## 2023-03-24 VITALS — BP 130/90 | HR 84 | Ht 68.0 in | Wt 300.0 lb

## 2023-03-24 DIAGNOSIS — M7661 Achilles tendinitis, right leg: Secondary | ICD-10-CM | POA: Diagnosis not present

## 2023-03-24 MED ORDER — PREDNISONE 10 MG (48) PO TBPK: ORAL_TABLET | Freq: Every day | ORAL | 0 refills | Status: DC

## 2023-03-24 NOTE — Progress Notes (Signed)
 Office Visit Note   Patient: Marvin Hodge           Date of Birth: 24-Jun-1985           MRN: [id number] Visit Date: 03/24/2023 Requested by: No referring provider defined for this encounter. PCP: Patient, No Pcp Per   Assessment & Plan:   Encounter Diagnosis  Name Primary?   Achilles tendinitis of right lower extremity Yes    Meds ordered this encounter  Medications   predniSONE  (STERAPRED UNI-PAK 48 TAB) 10 MG (48) TBPK tablet    Sig: Take by mouth daily.    Dispense:  48 tablet    Refill:  0    Rest Ice New steroid taper pack Out of work note 4 weeks follow-up CAM Walker when possible   Subjective: Chief Complaint  Patient presents with   Foot Pain    Foot pain for 1 week very painful can not put foot down taking prednisone      HPI: 38 year old male remote history of Achilles tendinitis that responded well to rest and ice presents with 1-1/2-week history of pain and swelling behind his right heel.  Currently on 50 mg daily prednisone , tried CAM Walker put too much pressure on the heel could not use it.  Using crutches.              ROS: Denies fever.   Images personally read and my interpretation : Outside imaging shows plantar spur and calcaneal Achilles insertional tendinitis and spur  Visit Diagnoses:  1. Achilles tendinitis of right lower extremity      Follow-Up Instructions: Return in about 6 weeks (around 05/05/2023) for FOLLOW UP, RIGHT achilles tendon, OOW NOTE x 4 weeks.    Objective: Vital Signs: BP (!) 130/90   Pulse 84   Ht 5' 8 (1.727 m)   Wt 300 lb (136.1 kg)   BMI 45.61 kg/m   Physical Exam Vitals and nursing note reviewed.  Constitutional:      Appearance: Normal appearance.  HENT:     Head: Normocephalic and atraumatic.  Eyes:     General: No scleral icterus.       Right eye: No discharge.        Left eye: No discharge.     Extraocular Movements: Extraocular movements intact.     Conjunctiva/sclera: Conjunctivae normal.      Pupils: Pupils are equal, round, and reactive to light.  Cardiovascular:     Rate and Rhythm: Normal rate.     Pulses: Normal pulses.  Skin:    General: Skin is warm and dry.     Capillary Refill: Capillary refill takes less than 2 seconds.  Neurological:     General: No focal deficit present.     Mental Status: He is alert and oriented to person, place, and time.     Gait: Gait abnormal.  Psychiatric:        Mood and Affect: Mood normal.        Behavior: Behavior normal.        Thought Content: Thought content normal.        Judgment: Judgment normal.      Right Ankle Exam    Comments:  Tenderness and swelling over the Achilles insertion the tendon has no nodularity no defect and no tenderness motion normal except painful at the insertion site       Specialty Comments:  No specialty comments available.  Imaging: No results found.   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  Family History  Family history unknown: Yes    No past surgical history on file. Social History   Occupational History   Not on file  Tobacco Use   Smoking status: Never    Passive exposure: Never   Smokeless tobacco: Never  Substance and Sexual Activity   Alcohol use: Never    Alcohol/week: 0.0 standard drinks of alcohol   Drug use: Never   Sexual activity: Not on file

## 2023-04-09 ENCOUNTER — Ambulatory Visit
Admission: RE | Admit: 2023-04-09 | Discharge: 2023-04-09 | Disposition: A | Discharge status: Home / Self Care | Payer: Managed Care, Other (non HMO) | Source: Ambulatory Visit | Attending: Family Medicine | Admitting: Family Medicine

## 2023-04-09 VITALS — BP 132/88 | HR 92 | Temp 98.2°F | Resp 18

## 2023-04-09 DIAGNOSIS — M109 Gout, unspecified: Secondary | ICD-10-CM

## 2023-04-09 MED ORDER — PREDNISONE 50 MG PO TABS: ORAL_TABLET | ORAL | 0 refills | Status: DC

## 2023-04-09 NOTE — ED Provider Notes (Signed)
 [facility]    CSN: [id number] Arrival date & time: 04/09/23  1056      History   Chief Complaint Chief Complaint  Patient presents with   Foot Pain    It feels like a gout flare up.  My right foot is a bit flared up and sensitive to pressure. - Entered by patient    HPI Marvin Hodge is a 38 y.o. male dents for foot pain.  Patient reports 4 days of pain, swelling and warmth to the distal dorsal aspect of his right foot.  States he has a history of gout and this is how his gout typically presents.  Denies any injury but does state he had some nuts/legumes recently.  No increase in alcohol or red meat.  He did have leftover prednisone 10 mg that he took with improvement in symptoms.  No fevers or chills.  No other concerns at this time.   Foot Pain    History reviewed. No pertinent past medical history.  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 No pertinent surgical history.     Home Medications    Prior to Admission medications   Medication Sig Start Date End Date Taking? Authorizing Provider  predniSONE (DELTASONE) 50 MG tablet Take 1 tablet daily with breakfast for 5 days 04/09/23  Yes Radford Pax, NP  HYDROcodone-acetaminophen (NORCO) 5-325 MG tablet Take 1 tablet by mouth every 6 (six) hours as needed for moderate pain (pain score 4-6). 03/07/23   Elpidio Anis, PA-C  ibuprofen (ADVIL) 600 MG tablet Take 1 tablet (600 mg total) by mouth 4 (four) times daily. 03/07/23   Elpidio Anis, PA-C    Family History Family History  Family history unknown: Yes    Social History Social History   Tobacco  Use   Smoking status: Never    Passive exposure: Never   Smokeless tobacco: Never  Substance Use Topics   Alcohol use: Never    Alcohol/week: 0.0 standard drinks of alcohol   Drug use: Never     Allergies   Patient has no known allergies.   Review of Systems Review of Systems  Skin:        Possible gout flare of right foot     Physical Exam Triage Vital Signs ED Triage Vitals  Encounter Vitals Group     BP 04/09/23 1107 132/88     Systolic BP Percentile --      Diastolic BP Percentile --      Pulse Rate 04/09/23 1107 92     Resp 04/09/23 1107 18     Temp 04/09/23 1107 98.2 F (36.8 C)     Temp Source 04/09/23 1107 Oral     SpO2 04/09/23 1107 97 %     Weight --      Height --      Head Circumference --      Peak Flow --      Pain Score 04/09/23 1103 8     Pain Loc --      Pain Education --      Exclude from Growth Chart --    No data found.  Updated Vital Signs BP 132/88 (BP Location: Left Arm)   Pulse 92   Temp 98.2 F (36.8 C) (Oral)   Resp 18   SpO2 97%   Visual Acuity Right Eye Distance:   Left Eye Distance:   Bilateral Distance:    Right Eye Near:   Left Eye Near:    Bilateral Near:     Physical Exam Vitals and nursing note reviewed.  Constitutional:      General: He is not in acute distress.    Appearance: Normal appearance. He is obese. He is not ill-appearing.  HENT:     Head: Normocephalic and atraumatic.  Eyes:     Pupils: Pupils are equal, round, and reactive to light.  Cardiovascular:     Rate and Rhythm: Normal rate.  Pulmonary:     Effort: Pulmonary effort is normal.  Musculoskeletal:       Feet:  Feet:     Comments: There is mild swelling without erythema or warmth to the distal dorsal aspect of the right foot.  Is some mild tenderness with palpation.  Gait is intact.  No rashes.  DP +2. Skin:    General: Skin is warm and dry.  Neurological:     General: No focal deficit present.     Mental Status: He is alert and  oriented to person, place, and time.  Psychiatric:        Mood and Affect: Mood normal.        Behavior: Behavior normal.      UC Treatments / Results  Labs (all labs ordered are listed, but only abnormal results are displayed) Labs Reviewed - No data to display  EKG   Radiology No results found.  Procedures Procedures (including critical care time)  Medications Ordered in UC Medications - No data to display  Initial Impression / Assessment and Plan / UC Course  I have reviewed the triage vital signs and the nursing notes.  Pertinent labs & imaging results that were available during my care of the patient were reviewed by me and considered in my medical decision making (see chart for details).     Reviewed exam and symptoms with patient.  No red flags.  Patient presenting with right foot pain that he states is the typical presentation for his gout.  Does  endorse increase in purine foods.  Will do prednisone daily for 5 days.  Advise follow-up with podiatrist or PCP if symptoms do not improve.  ER precautions reviewed. Final Clinical Impressions(s) / UC Diagnoses   Final diagnoses:  Acute gout of right foot, unspecified cause     Discharge Instructions      Start prednisone daily for 5 days.  Follow-up with your PCP or podiatrist if your symptoms do not improve.  Please go to the ER for any worsening symptoms.  Hope you feel better soon!    ED Prescriptions     Medication Sig Dispense Auth. Provider   predniSONE (DELTASONE) 50 MG tablet Take 1 tablet daily with breakfast for 5 days 5 tablet Radford Pax, NP      PDMP not reviewed this encounter.   Radford Pax, NP 04/09/23 1134

## 2023-04-09 NOTE — ED Triage Notes (Signed)
 Pt presents to UC for c/o right foot pain x4 days. Pain is located on top of foot and in toes. Pt reports swelling. Pt took prednisone leftover from another injury w/o relief.

## 2023-04-09 NOTE — Discharge Instructions (Addendum)
 Start prednisone daily for 5 days.  Follow-up with your PCP or podiatrist if your symptoms do not improve.  Please go to the ER for any worsening symptoms.  Hope you feel better soon!

## 2023-04-26 NOTE — Progress Notes (Unsigned)
 New patient visit   Patient: Marvin Hodge   DOB: 1985-06-08   38 y.o. Male  MRN: [id number] Visit Date: 04/28/2023  Today's healthcare provider: Alfredia Ferguson, PA-C   No chief complaint on file.  Subjective    Marvin Hodge is a 38 y.o. male who presents today as a new patient to establish care.   Gout   No past medical history on file. No past surgical history on file. No family status information on file.   Family History  Family history unknown: Yes   Social History   Socioeconomic History   Marital status: Married    Spouse name: Not on file   Number of children: Not on file   Years of education: Not on file   Highest education level: Not on file  Occupational History   Not on file  Tobacco Use   Smoking status: Never    Passive exposure: Never   Smokeless tobacco: Never  Substance and Sexual Activity   Alcohol use: Never    Alcohol/week: 0.0 standard drinks of alcohol   Drug use: Never   Sexual activity: Not on file  Other Topics Concern   Not on file  Social History Narrative   Not on file   Social Drivers of Health   Financial Resource Strain: Low Risk  (05/11/2021)   Received from [facility]   Overall Financial Resource Strain (CARDIA)    Difficulty of Paying Living Expenses: Not very hard  Food Insecurity: No Food Insecurity (05/11/2021)   Received from [facility]   Hunger Vital Sign    Worried About Running Out of Food in the Last Year: Never true    Ran Out of Food in the Last Year: Never true  Transportation Needs: Not on file  Physical Activity: Unknown (05/11/2021)   Received from [facility]   Exercise Vital Sign    Days of Exercise per Week: 3 days    Minutes of Exercise per Session: Not on file  Stress: No Stress Concern Present (05/11/2021)   Received from [facility] - Occupational Stress Questionnaire    Feeling of Stress : Not at all  Social Connections: Unknown (05/29/2022)    Received from [facility]   Social Network    Social Network: Not on file   Outpatient Medications Prior to Visit  Medication Sig   HYDROcodone-acetaminophen (NORCO) 5-325 MG tablet Take 1 tablet by mouth every 6 (six) hours as needed for moderate pain (pain score 4-6).   ibuprofen (ADVIL) 600 MG tablet Take 1 tablet (600 mg total) by mouth 4 (four) times daily.   predniSONE (DELTASONE) 50 MG tablet Take 1 tablet daily with breakfast for 5 days   No facility-administered medications prior to visit.   No Known Allergies  Immunization History  Administered Date(s) Administered   PFIZER(Purple Top)SARS-COV-2 Vaccination 07/03/2019, 07/24/2019    Health Maintenance  Topic Date Due   HIV Screening  Never done   Hepatitis C Screening  Never done   DTaP/Tdap/Td (1 - Tdap) Never done   INFLUENZA VACCINE  09/15/2022   COVID-19 Vaccine (4 - 2024-25 season) 10/16/2022   HPV VACCINES  Aged Out    Patient Care Team: Patient, No Pcp Per as PCP - General (General Practice)  Review of Systems  {Insert previous labs (optional):23779} {See past labs  Heme  Chem  Endocrine  Serology  Results Review (optional):1}   Objective    There were no vitals  taken for this visit. {Insert last BP/Wt (optional):23777}{See vitals history (optional):1}   Physical Exam ***  Depression Screen    03/02/2015   11:42 AM  PHQ 2/9 Scores  PHQ - 2 Score 0   No results found for any visits on 04/28/23.  Assessment & Plan     There are no diagnoses linked to this encounter.   No follow-ups on file.      Alfredia Ferguson, PA-C  [facility] Primary Care at [facility] [phone] (phone) [phone] (fax)  [facility]

## 2023-04-28 ENCOUNTER — Encounter: Payer: Self-pay | Admitting: Physician Assistant

## 2023-04-28 ENCOUNTER — Ambulatory Visit (INDEPENDENT_AMBULATORY_CARE_PROVIDER_SITE_OTHER): Admitting: Physician Assistant

## 2023-04-28 VITALS — BP 126/65 | HR 79 | Temp 98.4°F | Resp 16 | Ht 68.0 in | Wt 293.0 lb

## 2023-04-28 DIAGNOSIS — R5383 Other fatigue: Secondary | ICD-10-CM | POA: Diagnosis not present

## 2023-04-28 DIAGNOSIS — Z131 Encounter for screening for diabetes mellitus: Secondary | ICD-10-CM | POA: Diagnosis not present

## 2023-04-28 DIAGNOSIS — Z0000 Encounter for general adult medical examination without abnormal findings: Secondary | ICD-10-CM | POA: Diagnosis not present

## 2023-04-28 DIAGNOSIS — M1A0790 Idiopathic chronic gout, unspecified ankle and foot, without tophus (tophi): Secondary | ICD-10-CM

## 2023-04-28 DIAGNOSIS — Z13220 Encounter for screening for lipoid disorders: Secondary | ICD-10-CM | POA: Diagnosis not present

## 2023-04-28 LAB — LIPID PANEL
Cholesterol: 277 mg/dL — ABNORMAL HIGH (ref 0–200)
HDL: 45 mg/dL (ref >39.00)
LDL Cholesterol: 206 mg/dL — ABNORMAL HIGH (ref 0–99)
NonHDL: 232.41
Total CHOL/HDL Ratio: 6
Triglycerides: 134 mg/dL (ref 0.0–149.0)
VLDL: 26.8 mg/dL (ref 0.0–40.0)

## 2023-04-28 LAB — URIC ACID: Uric Acid, Serum: 11.5 mg/dL — ABNORMAL HIGH (ref 4.0–7.8)

## 2023-04-28 LAB — COMPREHENSIVE METABOLIC PANEL
ALT: 36 U/L (ref 0–53)
AST: 25 U/L (ref 0–37)
Albumin: 4.3 g/dL (ref 3.5–5.2)
Alkaline Phosphatase: 45 U/L (ref 39–117)
BUN: 13 mg/dL (ref 6–23)
CO2: 31 meq/L (ref 19–32)
Calcium: 9.4 mg/dL (ref 8.4–10.5)
Chloride: 100 meq/L (ref 96–112)
Creatinine, Ser: 1.04 mg/dL (ref 0.40–1.50)
GFR: 91.43 mL/min (ref >60.00)
Glucose, Bld: 119 mg/dL — ABNORMAL HIGH (ref 70–99)
Potassium: 3.9 meq/L (ref 3.5–5.1)
Sodium: 140 meq/L (ref 135–145)
Total Bilirubin: 0.5 mg/dL (ref 0.2–1.2)
Total Protein: 7.7 g/dL (ref 6.0–8.3)

## 2023-04-28 LAB — CBC WITH DIFFERENTIAL/PLATELET
Basophils Absolute: 0 10*3/uL (ref 0.0–0.1)
Basophils Relative: 0.8 % (ref 0.0–3.0)
Eosinophils Absolute: 0.2 10*3/uL (ref 0.0–0.7)
Eosinophils Relative: 2.7 % (ref 0.0–5.0)
HCT: 47.4 % (ref 39.0–52.0)
Hemoglobin: 15.4 g/dL (ref 13.0–17.0)
Lymphocytes Relative: 33 % (ref 12.0–46.0)
Lymphs Abs: 2.1 10*3/uL (ref 0.7–4.0)
MCHC: 32.5 g/dL (ref 30.0–36.0)
MCV: 80.7 fl (ref 78.0–100.0)
Monocytes Absolute: 0.7 10*3/uL (ref 0.1–1.0)
Monocytes Relative: 10.2 % (ref 3.0–12.0)
Neutro Abs: 3.4 10*3/uL (ref 1.4–7.7)
Neutrophils Relative %: 53.3 % (ref 43.0–77.0)
Platelets: 286 10*3/uL (ref 150.0–400.0)
RBC: 5.87 Mil/uL — ABNORMAL HIGH (ref 4.22–5.81)
RDW: 15 % (ref 11.5–15.5)
WBC: 6.4 10*3/uL (ref 4.0–10.5)

## 2023-04-28 LAB — MICROALBUMIN / CREATININE URINE RATIO
Creatinine,U: 136.7 mg/dL
Microalb Creat Ratio: 6 mg/g (ref 0.0–30.0)
Microalb, Ur: 0.8 mg/dL (ref 0.0–1.9)

## 2023-04-28 LAB — HEMOGLOBIN A1C: Hgb A1c MFr Bld: 6.4 % (ref 4.6–6.5)

## 2023-04-28 MED ORDER — COLCHICINE 0.6 MG PO TABS: ORAL_TABLET | ORAL | 3 refills | Status: AC

## 2023-04-28 NOTE — Assessment & Plan Note (Signed)
-   Check kidney function and uric acid level - Prescribe colchicine for gout flares - Advise on dietary modifications to prevent flares

## 2023-05-01 ENCOUNTER — Encounter: Payer: Self-pay | Admitting: Physician Assistant

## 2023-05-01 DIAGNOSIS — R7303 Prediabetes: Secondary | ICD-10-CM | POA: Insufficient documentation

## 2023-05-02 ENCOUNTER — Other Ambulatory Visit: Payer: Self-pay | Admitting: Physician Assistant

## 2023-05-02 DIAGNOSIS — M1A0790 Idiopathic chronic gout, unspecified ankle and foot, without tophus (tophi): Secondary | ICD-10-CM

## 2023-05-02 DIAGNOSIS — E785 Hyperlipidemia, unspecified: Secondary | ICD-10-CM

## 2023-05-02 MED ORDER — ROSUVASTATIN CALCIUM 40 MG PO TABS: 40.0000 mg | ORAL_TABLET | Freq: Every day | ORAL | 3 refills | Status: DC

## 2023-05-02 MED ORDER — ALLOPURINOL 100 MG PO TABS: 100.0000 mg | ORAL_TABLET | Freq: Every day | ORAL | 1 refills | Status: DC

## 2023-05-04 LAB — TESTOSTERONE,FREE AND TOTAL
Testosterone, Free: 7.8 pg/mL — ABNORMAL LOW (ref 8.7–25.1)
Testosterone: 260 ng/dL — ABNORMAL LOW (ref 264–916)

## 2023-05-05 ENCOUNTER — Ambulatory Visit: Payer: Managed Care, Other (non HMO) | Admitting: Orthopedic Surgery

## 2023-05-05 DIAGNOSIS — M7661 Achilles tendinitis, right leg: Secondary | ICD-10-CM

## 2023-05-05 NOTE — Progress Notes (Signed)
   There were no vitals taken for this visit.  There is no height or weight on file to calculate BMI.  Chief Complaint  Patient presents with   Ankle Pain    Achilles right     No diagnosis found.  DOI/DOS/ Date:    Improved  Achilles tendinitis treated with rest activity modification prednisone  Patient got better after about 4 weeks  Took a 4 mile walk and did well  Exam normal no pain full range of motion no tenderness redness or swelling  Released

## 2023-05-05 NOTE — Progress Notes (Signed)
   There were no vitals taken for this visit.  There is no height or weight on file to calculate BMI.  Chief Complaint  Patient presents with   Ankle Pain    Achilles right     No diagnosis found.  DOI/DOS/ Date:    Improved

## 2023-05-08 ENCOUNTER — Other Ambulatory Visit: Payer: Self-pay | Admitting: Physician Assistant

## 2023-05-08 DIAGNOSIS — R0681 Apnea, not elsewhere classified: Secondary | ICD-10-CM

## 2023-05-08 DIAGNOSIS — R7989 Other specified abnormal findings of blood chemistry: Secondary | ICD-10-CM

## 2023-07-22 ENCOUNTER — Ambulatory Visit: Payer: Self-pay | Admitting: Urgent Care

## 2023-07-22 ENCOUNTER — Ambulatory Visit
Admission: EM | Admit: 2023-07-22 | Discharge: 2023-07-22 | Disposition: A | Discharge status: Home / Self Care | Attending: Family Medicine | Admitting: Family Medicine

## 2023-07-22 ENCOUNTER — Ambulatory Visit (INDEPENDENT_AMBULATORY_CARE_PROVIDER_SITE_OTHER)

## 2023-07-22 DIAGNOSIS — M25562 Pain in left knee: Secondary | ICD-10-CM

## 2023-07-22 MED ORDER — MELOXICAM 15 MG PO TABS: 15.0000 mg | ORAL_TABLET | Freq: Every day | ORAL | 1 refills | Status: AC

## 2023-07-22 NOTE — ED Triage Notes (Signed)
 Pt c/o pain, swelling to left knee started while walking "heard a pop" on 6/3-NAD-walking with one crutch/limping gait

## 2023-07-22 NOTE — ED Provider Notes (Signed)
 [facility]  Note:  This document was prepared using Conservation officer, historic buildings and may include unintentional dictation errors.  MRN: [id number] DOB: 03-14-1985  Subjective:   Marvin Hodge is a 38 y.o. male presenting for 3 day history of persistent left knee pain. Injured while walking on stairs. Heard a pop and has since had progressively worsening left knee pain, decreased ROM, swelling. Symptoms were worse last night. Has been using a crutch, icing, heating. Has used ibuprofen  with minimal relief.  No history of left knee injuries.  No fever, redness, warmth, wounds, drainage or pus or bleeding.  Has a history of gout but this is a very different presentation for the patient.  He does see an orthopedist for Achilles tendinitis.  No current facility-administered medications for this encounter.  Current Outpatient Medications:    allopurinol  (ZYLOPRIM ) 100 MG tablet, Take 1 tablet (100 mg total) by mouth daily., Disp: 60 tablet, Rfl: 1   colchicine  0.6 MG tablet, Day 1: 1.2 mg at the first sign of flare, followed by 0.6 mg after 1 hour. Day 2 onward: 0.6 mg twice daily until flare resolves, Disp: 30 tablet, Rfl: 3   Multiple Vitamin (MULTIVITAMIN WITH MINERALS) TABS tablet, Take 1 tablet by mouth daily., Disp: , Rfl:    rosuvastatin  (CRESTOR ) 40 MG tablet, Take 1 tablet (40 mg total) by mouth daily. In the evening, Disp: 90 tablet, Rfl: 3   No Known Allergies  Past Medical History:  Diagnosis Date   Gout      History reviewed. No pertinent surgical history.  Family History  Problem Relation Age of Onset   Diabetes Maternal Grandmother     Social History   Tobacco Use   Smoking status: Never    Passive exposure: Never   Smokeless tobacco: Never  Vaping Use   Vaping status: Never Used  Substance Use Topics   Alcohol use: Not Currently   Drug use: Never    ROS   Objective:   Vitals: BP 113/78 (BP Location: Right Arm)   Pulse 99   Temp  97.9 F (36.6 C) (Oral)   Resp 20   SpO2 97%   Physical Exam Constitutional:      General: He is not in acute distress.    Appearance: Normal appearance. He is well-developed and normal weight. He is not ill-appearing, toxic-appearing or diaphoretic.  HENT:     Head: Normocephalic and atraumatic.     Right Ear: External ear normal.     Left Ear: External ear normal.     Nose: Nose normal.     Mouth/Throat:     Pharynx: Oropharynx is clear.  Eyes:     General: No scleral icterus.       Right eye: No discharge.        Left eye: No discharge.     Extraocular Movements: Extraocular movements intact.  Cardiovascular:     Rate and Rhythm: Normal rate.  Pulmonary:     Effort: Pulmonary effort is normal.  Musculoskeletal:     Cervical back: Normal range of motion.     Left knee: Swelling, effusion and bony tenderness present. No erythema, ecchymosis, lacerations or crepitus. Decreased range of motion. Tenderness present over the medial joint line and patellar tendon. No lateral joint line tenderness. Normal alignment and normal patellar mobility.  Neurological:     Mental Status: He is alert and oriented to person, place, and time.  Psychiatric:  Mood and Affect: Mood normal.        Behavior: Behavior normal.        Thought Content: Thought content normal.        Judgment: Judgment normal.    A 6 inch Ace wrap was applied to the left knee.  Assessment and Plan :   PDMP not reviewed this encounter.  1. Acute pain of left knee    X-ray over-read was pending at time of discharge, recommended follow up with only abnormal results. Otherwise will not call for negative over-read. Patient was in agreement.  No history of liver or kidney disease and therefore appropriate for meloxicam .  Use general RICE method.  Follow-up with [facility] soon as possible.  Counseled patient on potential for adverse effects with medications prescribed/recommended today, ER and return-to-clinic  precautions discussed, patient verbalized understanding.    Adolph Hoop, [place] 07/22/23 1348

## 2023-07-22 NOTE — Discharge Instructions (Signed)
 Will call with your x-ray results later today if not tomorrow. Wear the Ace wrap except in your sleep. Use icing 20 minutes on, 2 hours off. Meloxicam  for pain and inflammation. Follow up with [facility] asap.

## 2023-07-28 ENCOUNTER — Other Ambulatory Visit: Payer: Self-pay | Admitting: Physician Assistant

## 2023-07-28 DIAGNOSIS — M1A0790 Idiopathic chronic gout, unspecified ankle and foot, without tophus (tophi): Secondary | ICD-10-CM

## 2023-08-28 NOTE — Progress Notes (Unsigned)
      Established patient visit   Patient: Marvin Hodge   DOB: May 29, 1985   38 y.o. Male  MRN: [id number] Visit Date: 08/29/2023  Today's healthcare provider: Manuelita Flatness, PA-C   No chief complaint on file.  Subjective     ***  Medications: Outpatient Medications Prior to Visit  Medication Sig   allopurinol  (ZYLOPRIM ) 100 MG tablet Take 1 tablet (100 mg total) by mouth daily.   colchicine  0.6 MG tablet Day 1: 1.2 mg at the first sign of flare, followed by 0.6 mg after 1 hour. Day 2 onward: 0.6 mg twice daily until flare resolves   meloxicam  (MOBIC ) 15 MG tablet Take 1 tablet (15 mg total) by mouth daily.   Multiple Vitamin (MULTIVITAMIN WITH MINERALS) TABS tablet Take 1 tablet by mouth daily.   rosuvastatin  (CRESTOR ) 40 MG tablet Take 1 tablet (40 mg total) by mouth daily. In the evening   No facility-administered medications prior to visit.    Review of Systems {Insert previous labs (optional):23779} {See past labs  Heme  Chem  Endocrine  Serology  Results Review (optional):1}   Objective    There were no vitals taken for this visit. {Insert last BP/Wt (optional):23777}{See vitals history (optional):1}  Physical Exam  ***  No results found for any visits on 08/29/23.  Assessment & Plan    There are no diagnoses linked to this encounter.  ***  No follow-ups on file.       Manuelita Flatness, PA-C  [facility] Primary Care at [facility] [phone] (phone) [phone] (fax)  [facility]

## 2023-08-29 ENCOUNTER — Ambulatory Visit: Payer: Self-pay | Admitting: Physician Assistant

## 2023-08-29 ENCOUNTER — Ambulatory Visit (INDEPENDENT_AMBULATORY_CARE_PROVIDER_SITE_OTHER): Admitting: Physician Assistant

## 2023-08-29 DIAGNOSIS — R7303 Prediabetes: Secondary | ICD-10-CM

## 2023-08-29 DIAGNOSIS — M1A0790 Idiopathic chronic gout, unspecified ankle and foot, without tophus (tophi): Secondary | ICD-10-CM | POA: Diagnosis not present

## 2023-08-29 DIAGNOSIS — R7989 Other specified abnormal findings of blood chemistry: Secondary | ICD-10-CM | POA: Diagnosis not present

## 2023-08-29 LAB — BASIC METABOLIC PANEL WITH GFR
BUN: 20 mg/dL (ref 6–23)
CO2: 27 meq/L (ref 19–32)
Calcium: 9.9 mg/dL (ref 8.4–10.5)
Chloride: 101 meq/L (ref 96–112)
Creatinine, Ser: 1.12 mg/dL (ref 0.40–1.50)
GFR: 83.46 mL/min (ref >60.00)
Glucose, Bld: 120 mg/dL — ABNORMAL HIGH (ref 70–99)
Potassium: 3.6 meq/L (ref 3.5–5.1)
Sodium: 137 meq/L (ref 135–145)

## 2023-08-29 LAB — URIC ACID: Uric Acid, Serum: 10.8 mg/dL — ABNORMAL HIGH (ref 4.0–7.8)

## 2023-08-29 MED ORDER — PHENTERMINE HCL 37.5 MG PO CAPS: 37.5000 mg | ORAL_CAPSULE | ORAL | 0 refills | Status: AC

## 2023-08-29 NOTE — Assessment & Plan Note (Signed)
 Pt never returned for uric acid recheck. Taking allopurinol  100 mg Repeat uric acid/bmp today

## 2023-08-29 NOTE — Assessment & Plan Note (Signed)
 Previously used phentermine  effectively without side effects.  - Prescribe phentermine  for 3 months - Recheck blood work in 3 months to assess A1c and other parameters

## 2023-08-29 NOTE — Assessment & Plan Note (Signed)
 Reviewed his A1c was a 6.4%, DM is 6.5% and above. Emphasized lifestyle changes to prevent progression to diabetes.  Given labs were 4 mo ago, suggesting repeating today. Pt declines. Opted to focus on weight loss with phentermine  initially.

## 2023-08-29 NOTE — Assessment & Plan Note (Signed)
 Testosterone  levels borderline low. Diagnosis requires three low readings. Factors such as weight, sleep patterns, exercise, and diet can affect levels. Suggested lifestyle modifications to boost testosterone  naturally.

## 2024-03-14 ENCOUNTER — Encounter: Payer: Self-pay | Admitting: Physician Assistant

## 2024-03-14 ENCOUNTER — Ambulatory Visit

## 2024-03-14 ENCOUNTER — Ambulatory Visit: Admitting: Physician Assistant

## 2024-03-14 ENCOUNTER — Other Ambulatory Visit: Payer: Self-pay | Admitting: Physician Assistant

## 2024-03-14 DIAGNOSIS — R200 Anesthesia of skin: Secondary | ICD-10-CM

## 2024-03-14 DIAGNOSIS — E349 Endocrine disorder, unspecified: Secondary | ICD-10-CM

## 2024-03-14 DIAGNOSIS — M1A0790 Idiopathic chronic gout, unspecified ankle and foot, without tophus (tophi): Secondary | ICD-10-CM

## 2024-03-14 DIAGNOSIS — R058 Other specified cough: Secondary | ICD-10-CM

## 2024-03-14 DIAGNOSIS — Z6841 Body Mass Index (BMI) 40.0 and over, adult: Secondary | ICD-10-CM

## 2024-03-14 DIAGNOSIS — E291 Testicular hypofunction: Secondary | ICD-10-CM

## 2024-03-14 DIAGNOSIS — R202 Paresthesia of skin: Secondary | ICD-10-CM

## 2024-03-14 DIAGNOSIS — E785 Hyperlipidemia, unspecified: Secondary | ICD-10-CM

## 2024-03-14 DIAGNOSIS — R7989 Other specified abnormal findings of blood chemistry: Secondary | ICD-10-CM

## 2024-03-14 DIAGNOSIS — Z23 Encounter for immunization: Secondary | ICD-10-CM

## 2024-03-14 LAB — COMPREHENSIVE METABOLIC PANEL WITH GFR
ALT: 28 U/L (ref 3–53)
AST: 28 U/L (ref 5–37)
Albumin: 4.2 g/dL (ref 3.5–5.2)
Alkaline Phosphatase: 49 U/L (ref 39–117)
BUN: 15 mg/dL (ref 6–23)
CO2: 29 meq/L (ref 19–32)
Calcium: 9.3 mg/dL (ref 8.4–10.5)
Chloride: 97 meq/L (ref 96–112)
Creatinine, Ser: 0.94 mg/dL (ref 0.40–1.50)
GFR: 102.59 mL/min
Glucose, Bld: 97 mg/dL (ref 70–99)
Potassium: 3.6 meq/L (ref 3.5–5.1)
Sodium: 134 meq/L — ABNORMAL LOW (ref 135–145)
Total Bilirubin: 0.8 mg/dL (ref 0.2–1.2)
Total Protein: 7.7 g/dL (ref 6.0–8.3)

## 2024-03-14 LAB — TESTOSTERONE: Testosterone: 178.16 ng/dL — ABNORMAL LOW (ref 300.00–890.00)

## 2024-03-14 LAB — TSH: TSH: 1.78 u[IU]/mL (ref 0.35–5.50)

## 2024-03-14 LAB — CBC WITH DIFFERENTIAL/PLATELET
Basophils Absolute: 0.1 10*3/uL (ref 0.0–0.1)
Basophils Relative: 0.6 % (ref 0.0–3.0)
Eosinophils Absolute: 0.2 10*3/uL (ref 0.0–0.7)
Eosinophils Relative: 2 % (ref 0.0–5.0)
HCT: 46.4 % (ref 39.0–52.0)
Hemoglobin: 15.2 g/dL (ref 13.0–17.0)
Lymphocytes Relative: 23.9 % (ref 12.0–46.0)
Lymphs Abs: 2.3 10*3/uL (ref 0.7–4.0)
MCHC: 32.7 g/dL (ref 30.0–36.0)
MCV: 78.2 fl (ref 78.0–100.0)
Monocytes Absolute: 0.8 10*3/uL (ref 0.1–1.0)
Monocytes Relative: 8.2 % (ref 3.0–12.0)
Neutro Abs: 6.3 10*3/uL (ref 1.4–7.7)
Neutrophils Relative %: 65.3 % (ref 43.0–77.0)
Platelets: 261 10*3/uL (ref 150.0–400.0)
RBC: 5.93 Mil/uL — ABNORMAL HIGH (ref 4.22–5.81)
RDW: 15.1 % (ref 11.5–15.5)
WBC: 9.6 10*3/uL (ref 4.0–10.5)

## 2024-03-14 LAB — LIPID PANEL
Cholesterol: 263 mg/dL — ABNORMAL HIGH (ref 28–200)
HDL: 46.5 mg/dL
LDL Cholesterol: 187 mg/dL — ABNORMAL HIGH (ref 10–99)
NonHDL: 216.58
Total CHOL/HDL Ratio: 6
Triglycerides: 149 mg/dL (ref 10.0–149.0)
VLDL: 29.8 mg/dL (ref 0.0–40.0)

## 2024-03-14 LAB — URIC ACID: Uric Acid, Serum: 11.7 mg/dL — ABNORMAL HIGH (ref 4.0–7.8)

## 2024-03-14 LAB — VITAMIN B12: Vitamin B-12: 1044 pg/mL — ABNORMAL HIGH (ref 211–911)

## 2024-03-14 MED ORDER — ROSUVASTATIN CALCIUM 40 MG PO TABS: 40.0000 mg | ORAL_TABLET | Freq: Every day | ORAL | 3 refills | Status: AC

## 2024-03-14 MED ORDER — ALLOPURINOL 100 MG PO TABS: 100.0000 mg | ORAL_TABLET | Freq: Every day | ORAL | 2 refills | Status: AC

## 2024-03-14 MED ORDER — BENZONATATE 100 MG PO CAPS: 100.0000 mg | ORAL_CAPSULE | Freq: Two times a day (BID) | ORAL | 0 refills | Status: AC | PRN

## 2024-03-14 NOTE — Assessment & Plan Note (Addendum)
-   Intermittent tingling and numbness, more in right hand.  - DDx includes carpal tunnel, peripheral neuropathy, B12 deficiency.  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 - CBC, CMP, A1c, and B12 labs ordered. - If no obvious metabolic derangement, consider splints and referral to hand surgery. - No reported history of neck pain or previous trauma.

## 2024-03-14 NOTE — Assessment & Plan Note (Addendum)
-   Previous cholesterol 277.  - Not taking medication regularly.  Stressed importance. - Will recheck lipid panel. - Reordered rosuvastatin  40 mg daily.

## 2024-03-14 NOTE — Assessment & Plan Note (Signed)
-   Endorses diminished libido.  - Low testosterone  previously assessed. - Will re-check labs. - Consider sleep study due to family's report of snoring and occasional apneic breathing. - Emphasized importance of good sleep hygiene, as well. Reportedly limited due to family obligations and working overnight shift.

## 2024-03-14 NOTE — Patient Instructions (Addendum)
" °  VISIT SUMMARY: During your visit, we discussed several health concerns including numbness and tingling in your hands, a persistent cough, weight management, gout, and high cholesterol. We reviewed your symptoms, medical history, and current medications to develop a comprehensive plan for your care.  YOUR PLAN: -MORBID OBESITY: Morbid obesity means having a body mass index (BMI) of 40 or higher. We discussed the possibility of using GLP-1 agonists like tirzepatide for weight management, but insurance coverage and lifestyle modifications are necessary. We ordered several lab tests (A1c, TSH, lipids, CBC, CMP, testosterone , B12) and encouraged you to track your diet and engage in moderate exercise four times a week. We will follow up in one month to reassess your weight management plan.  -POST-VIRAL COUGH SYNDROME: Post-viral cough syndrome is a persistent cough following a viral infection. We prescribed benzonatate  to help manage your cough and advised you to continue using Mucinex. We also suggested considering an antihistamine and Flonase for postnasal drip.  -BILATERAL HAND PARESTHESIA: Bilateral hand paresthesia refers to tingling and numbness in both hands. We are considering several possible causes, including carpal tunnel syndrome, diabetic peripheral neuropathy, and B12 deficiency. We ordered a B12 level and an A1c test to assess for diabetes. If B12 deficiency is ruled out, we may recommend using splints at night and possibly referring you to a hand specialist if symptoms persist.  -CHRONIC GOUT OF FOOT: Chronic gout is a form of arthritis characterized by severe pain, redness, and tenderness in joints. We reordered allopurinol  for long-term management and advised you to continue taking colchicine  as needed for flare-ups.  -HYPERLIPIDEMIA: Hyperlipidemia means having high levels of cholesterol in the blood. We reordered rosuvastatin  40 mg daily to help manage your cholesterol levels and will  reassess with lipid panel today.  INSTRUCTIONS: Please follow up in one month to discuss weight management. Continue with the prescribed medications and lifestyle modifications. If your symptoms persist or worsen, please contact our office.    Contains text generated by [employer].   "

## 2024-03-14 NOTE — Assessment & Plan Note (Signed)
-   Viral illness approximately one month ago. - Lingering cough. - Robitussin and Mucinex. - Afebrile. - Add antihistamines and Flonase. - Add tessalon  perles.

## 2024-03-14 NOTE — Assessment & Plan Note (Addendum)
-   Currently at peak weight. - Previous phentermine  prescription was poorly tolerated. - Ordered labs: A1c, TSH, lipids, CBC, CMP, testosterone , B12. - Encouraged diet tracking and moderate exercise four times weekly. - Discussed potential GLP-1 agonists if can get approved. - Scheduled follow-up in one month for weight management discussion.

## 2024-03-14 NOTE — Assessment & Plan Note (Addendum)
-   Chronic gout with only two reported flare-ups since July, primarily involving right foot. Previous abortive treatment effective. - Reordered allopurinol . - Continue colchicine  as needed for flare-ups.

## 2024-03-14 NOTE — Progress Notes (Signed)
 "  New Patient Office Visit  Subjective    Patient ID: Marvin Hodge, male    DOB: December 24, 1985  Age: 39 y.o. MRN: [id number]  CC:  Chief Complaint  Patient presents with   Transitions Of Care   Numbness    Patient reports numbness of both hands   History of Present Illness Marvin Hodge is a 39 year old male who presents to clinic to transfer care to new provider and to discuss numbness and tingling in his hands.  He has been experiencing numbness and tingling in his hands, starting in the right hand and then affecting the left. The sensation occurs intermittently throughout the day and has been present since November or December. The tingling can progress to complete numbness if his hands are left in certain positions, but symptoms are relieved by shaking his hands. 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 No burning sensation or pain is associated with the tingling.  He has a history of prediabetes with a previous A1c of 6.4. He has not been experiencing fatigue recently but reports low libido. He has tried phentermine  for weight loss but found it made him too jittery. He has a BMI of 44 and has not been able to engage in regular diet or exercise due to working 12-hour night shifts and childcare responsibilities.  He reports a lingering cough x 1 month following a cold. The cough is occasionally productive of yellow phlegm, particularly in the mornings and sometimes during the day. He has been using Robitussin and occasionally Mucinex to manage the symptoms. No fever or sore throat, but he has had mild ongoing nasal congestion.  He has a history of gout, with two flare-ups since July, primarily affecting the top of his right foot. He has not had a refill of his medication since September but reports that the medication was effective during flare-ups.  He has been taking rosuvastatin  for hyperlipidemia, although he admits that he has not been consistent with daily  use. His last recorded cholesterol level was 277. He has a family history of diabetes in his maternal grandmother.    Outpatient Encounter Medications as of 03/14/2024  Medication Sig   benzonatate  (TESSALON ) 100 MG capsule Take 1 capsule (100 mg total) by mouth 2 (two) times daily as needed for cough.   colchicine  0.6 MG tablet Day 1: 1.2 mg at the first sign of flare, followed by 0.6 mg after 1 hour. Day 2 onward: 0.6 mg twice daily until flare resolves   meloxicam  (MOBIC ) 15 MG tablet Take 1 tablet (15 mg total) by mouth daily.   Multiple Vitamin (MULTIVITAMIN WITH MINERALS) TABS tablet Take 1 tablet by mouth daily.   phentermine  37.5 MG capsule Take 1 capsule (37.5 mg total) by mouth every morning.   [DISCONTINUED] allopurinol  (ZYLOPRIM ) 100 MG tablet Take 1 tablet (100 mg total) by mouth daily.   [DISCONTINUED] rosuvastatin  (CRESTOR ) 40 MG tablet Take 1 tablet (40 mg total) by mouth daily. In the evening   allopurinol  (ZYLOPRIM ) 100 MG tablet Take 1 tablet (100 mg total) by mouth daily.   rosuvastatin  (CRESTOR ) 40 MG tablet Take 1 tablet (40 mg total) by mouth daily. In the evening   No facility-administered encounter medications on file as of 03/14/2024.    Past Medical History:  Diagnosis Date   Gout     History reviewed. No pertinent surgical history.  Family History  Problem Relation Age of Onset   Diabetes Maternal Grandmother  Social History   Socioeconomic History   Marital status: Married    Spouse name: Not on file   Number of children: Not on file   Years of education: Not on file   Highest education level: GED or equivalent  Occupational History   Not on file  Tobacco Use   Smoking status: Never    Passive exposure: Never   Smokeless tobacco: Never  Vaping Use   Vaping status: Never Used  Substance and Sexual Activity   Alcohol use: Not Currently   Drug use: Never   Sexual activity: Not on file  Other Topics Concern   Not on file  Social History  Narrative   Not on file   Social Drivers of Health   Tobacco Use: Low Risk (08/29/2023)   Patient History    Smoking Tobacco Use: Never    Smokeless Tobacco Use: Never    Passive Exposure: Never  Financial Resource Strain: Medium Risk (08/29/2023)   Overall Financial Resource Strain (CARDIA)    Difficulty of Paying Living Expenses: Somewhat hard  Food Insecurity: Food Insecurity Present (08/29/2023)   Epic    Worried About Programme Researcher, Broadcasting/film/video in the Last Year: Sometimes true    Ran Out of Food in the Last Year: Sometimes true  Transportation Needs: No Transportation Needs (08/29/2023)   Epic    Lack of Transportation (Medical): No    Lack of Transportation (Non-Medical): No  Physical Activity: Insufficiently Active (08/29/2023)   Exercise Vital Sign    Days of Exercise per Week: 2 days    Minutes of Exercise per Session: 20 min  Stress: No Stress Concern Present (08/29/2023)   [employer] of Occupational Health - Occupational Stress Questionnaire    Feeling of Stress: Not at all  Social Connections: Socially Isolated (08/29/2023)   Social Connection and Isolation Panel    Frequency of Communication with Friends and Family: Once a week    Frequency of Social Gatherings with Friends and Family: Once a week    Attends Religious Services: Never    Database Administrator or Organizations: No    Attends Engineer, Structural: Not on file    Marital Status: Married  Intimate Partner Violence: Unknown (05/11/2021)   Received from [facility]   HITS    Over the last 12 months how often did your partner scream or curse at you?: Never    Physically Hurt: Not on file    Over the last 12 months how often did your partner insult you or talk down to you?: Never    Over the last 12 months how often did your partner threaten you with physical harm?: Never  Depression (PHQ2-9): Low Risk (04/28/2023)   Depression (PHQ2-9)    PHQ-2 Score: 0  Alcohol Screen: Low Risk (08/29/2023)    Alcohol Screen    Last Alcohol Screening Score (AUDIT): 1  Housing: Unknown (08/29/2023)   Epic    Unable to Pay for Housing in the Last Year: Patient declined    Number of Times Moved in the Last Year: Not on file    Homeless in the Last Year: No  Utilities: Not on file  Health Literacy: Not on file    ROS See HPI, all else negative.      Objective    BP 125/71 (BP Location: Right Arm, Patient Position: Sitting, Cuff Size: Large)   Pulse 86   Temp 98 F (36.7 C) (Oral)   Resp 16   Ht  5' 9 (1.753 m)   Wt (!) 302 lb (137 kg)   SpO2 99%   BMI 44.60 kg/m   Physical Exam Constitutional:      Appearance: Normal appearance.  Eyes:     Extraocular Movements: Extraocular movements intact.  Cardiovascular:     Rate and Rhythm: Normal rate and regular rhythm.     Pulses: Normal pulses.     Heart sounds: Normal heart sounds.  Pulmonary:     Effort: Pulmonary effort is normal.     Breath sounds: Normal breath sounds.  Musculoskeletal:        General: Normal range of motion.     Cervical back: Normal range of motion.     Comments: Negative Phalen's and negative Tinel's sign bilaterally. Neurovascularly intact.  Neurological:     Mental Status: He is alert and oriented to person, place, and time.  Psychiatric:        Behavior: Behavior normal.         Assessment & Plan:   Problem List Items Addressed This Visit       Respiratory   Post-viral cough syndrome   - Viral illness approximately one month ago. - Lingering cough. - Robitussin and Mucinex. - Afebrile. - Add antihistamines and Flonase. - Add tessalon  perles.        Musculoskeletal and Integument   Chronic gout of foot   - Chronic gout with only two reported flare-ups since July, primarily involving right foot. Previous abortive treatment effective. - Reordered allopurinol . - Continue colchicine  as needed for flare-ups.      Relevant Medications   allopurinol  (ZYLOPRIM ) 100 MG tablet   Other  Relevant Orders   Uric acid     Other   HLD (hyperlipidemia)   - Previous cholesterol 277.  - Not taking medication regularly.  Stressed importance. - Will recheck lipid panel. - Reordered rosuvastatin  40 mg daily.       Relevant Medications   rosuvastatin  (CRESTOR ) 40 MG tablet   Other Relevant Orders   Lipid panel   Morbid obesity (HCC) - Primary   - Currently at peak weight. - Previous phentermine  prescription was poorly tolerated. - Ordered labs: A1c, TSH, lipids, CBC, CMP, testosterone , B12. - Encouraged diet tracking and moderate exercise four times weekly. - Discussed potential GLP-1 agonists if can get approved. - Scheduled follow-up in one month for weight management discussion.      Relevant Orders   CBC with Differential/Platelet   Comprehensive metabolic panel with GFR   Hemoglobin A1c   TSH   Low testosterone    - Endorses diminished libido.  - Low testosterone  previously assessed. - Will re-check labs. - Consider sleep study due to family's report of snoring and occasional apneic breathing. - Emphasized importance of good sleep hygiene, as well. Reportedly limited due to family obligations and working overnight shift.      Numbness and tingling in both hands   - Intermittent tingling and numbness, more in right hand.  - DDx includes carpal tunnel, peripheral neuropathy, B12 deficiency.  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 - CBC, CMP, A1c, and B12 labs ordered. - If no obvious metabolic derangement, consider splints and referral to hand surgery. - No reported history of neck pain or previous trauma.       Relevant Orders   B12   Other Visit Diagnoses       Needs flu shot  Relevant Orders   Flu vaccine trivalent PF, 6mos and older(Flulaval,Afluria,Fluarix,Fluzone) (Completed)     Testosterone  deficiency       Relevant Orders   Testosterone          Return in about 4 weeks (around 04/11/2024) for Visit to discuss labs and weight loss medication options.   Honora Seip, PA-C   "

## 2024-03-15 ENCOUNTER — Ambulatory Visit: Payer: Self-pay | Admitting: Physician Assistant

## 2024-03-15 DIAGNOSIS — R7989 Other specified abnormal findings of blood chemistry: Secondary | ICD-10-CM

## 2024-03-15 LAB — HEMOGLOBIN A1C
Est. average glucose Bld gHb Est-mCnc: 126 mg/dL
Hgb A1c MFr Bld: 6 % — ABNORMAL HIGH (ref 4.8–5.6)

## 2024-04-11 ENCOUNTER — Encounter: Admitting: Physician Assistant
